# Patient Record
Sex: Female | Born: 1975 | Race: Black or African American | Hispanic: No | Smoking: Never smoker
Health system: Southern US, Community
[De-identification: ages and names within clinical notes are randomized; demographics above are authoritative.]

## PROBLEM LIST (undated history)

## (undated) DIAGNOSIS — I10 Essential (primary) hypertension: Secondary | ICD-10-CM

## (undated) DIAGNOSIS — N200 Calculus of kidney: Secondary | ICD-10-CM

## (undated) DIAGNOSIS — Z789 Other specified health status: Secondary | ICD-10-CM

## (undated) DIAGNOSIS — K529 Noninfective gastroenteritis and colitis, unspecified: Secondary | ICD-10-CM

## (undated) HISTORY — PX: TUBAL LIGATION: SHX77

---

## 2011-09-02 ENCOUNTER — Encounter (HOSPITAL_COMMUNITY): Payer: Self-pay

## 2011-09-02 ENCOUNTER — Emergency Department (HOSPITAL_COMMUNITY): Payer: Medicaid Other

## 2011-09-02 ENCOUNTER — Emergency Department (HOSPITAL_COMMUNITY)
Admission: EM | Admit: 2011-09-02 | Discharge: 2011-09-02 | Disposition: A | Discharge status: Home Health | Payer: Medicaid Other | Attending: Emergency Medicine | Admitting: Emergency Medicine

## 2011-09-02 DIAGNOSIS — M25571 Pain in right ankle and joints of right foot: Secondary | ICD-10-CM

## 2011-09-02 DIAGNOSIS — M25579 Pain in unspecified ankle and joints of unspecified foot: Secondary | ICD-10-CM | POA: Insufficient documentation

## 2011-09-02 MED ORDER — OXYCODONE-ACETAMINOPHEN 5-325 MG PO TABS
1.0000 | ORAL_TABLET | Freq: Once | ORAL | Status: AC
  Administered 2011-09-02: 1 via ORAL
  Filled 2011-09-02: qty 1

## 2011-09-02 MED ORDER — OXYCODONE-ACETAMINOPHEN 5-325 MG PO TABS: 1.0000 | ORAL_TABLET | ORAL | Status: AC | PRN

## 2011-09-02 MED ORDER — CYCLOBENZAPRINE HCL 10 MG PO TABS: 5.0000 mg | ORAL_TABLET | Freq: Two times a day (BID) | ORAL | Status: AC | PRN
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
## 2011-09-02 NOTE — Progress Notes (Signed)
Orthopedic Tech Progress Note Patient Details:  Christine Rowe 04-28-1976 [id number]  Other Ortho Devices Type of Ortho Device: Crutches;ASO Ortho Device Location: right ankle Ortho Device Interventions: Application   [name] 09/02/2011, 3:32 PM
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
## 2011-09-02 NOTE — ED Notes (Addendum)
Pt was cleared from the back board and c-collar by C. Williams PAC. No abrasion noted to legs nor rt hand. Family is at the bedside

## 2011-09-03 NOTE — ED Provider Notes (Signed)
Medical screening examination/treatment/procedure(s) were performed by non-physician practitioner and as supervising physician I was immediately available for consultation/collaboration.   Forbes Cellar, MD 09/03/11 1151

## 2012-02-22 ENCOUNTER — Emergency Department: Payer: Self-pay | Admitting: Emergency Medicine

## 2012-02-22 LAB — URINALYSIS, COMPLETE
Bilirubin,UR: NEGATIVE
Glucose,UR: NEGATIVE mg/dL (ref 0–75)
Ph: 8 (ref 4.5–8.0)
Squamous Epithelial: 2

## 2012-02-22 LAB — PREGNANCY, URINE: Pregnancy Test, Urine: NEGATIVE m[IU]/mL

## 2012-04-08 ENCOUNTER — Encounter (HOSPITAL_COMMUNITY): Payer: Self-pay | Admitting: Emergency Medicine

## 2012-04-08 ENCOUNTER — Emergency Department (HOSPITAL_COMMUNITY)
Admission: EM | Admit: 2012-04-08 | Discharge: 2012-04-08 | Disposition: A | Discharge status: Home / Self Care | Payer: Self-pay | Attending: Emergency Medicine | Admitting: Emergency Medicine

## 2012-04-08 DIAGNOSIS — J020 Streptococcal pharyngitis: Secondary | ICD-10-CM | POA: Insufficient documentation

## 2012-04-08 LAB — RAPID STREP SCREEN (MED CTR MEBANE ONLY): Streptococcus, Group A Screen (Direct): POSITIVE — AB

## 2012-04-08 MED ORDER — IBUPROFEN 800 MG PO TABS
800.0000 mg | ORAL_TABLET | Freq: Once | ORAL | Status: AC
  Administered 2012-04-08: 800 mg via ORAL
  Filled 2012-04-08: qty 1

## 2012-04-08 MED ORDER — AMOXICILLIN 500 MG PO CAPS
1000.0000 mg | ORAL_CAPSULE | Freq: Once | ORAL | Status: AC
  Administered 2012-04-08: 1000 mg via ORAL
  Filled 2012-04-08: qty 2

## 2012-04-08 MED ORDER — AMOXICILLIN 500 MG PO CAPS: 1000.0000 mg | ORAL_CAPSULE | Freq: Two times a day (BID) | ORAL | Status: DC

## 2012-04-08 NOTE — ED Notes (Signed)
Pt c/o severe sore throat and achy and feverish

## 2012-04-08 NOTE — ED Provider Notes (Signed)
History     CSN: [id number]  Arrival date & time 04/08/12  0915   First MD Initiated Contact with Patient 04/08/12 [phone]      Chief Complaint  Patient presents with  . Sore Throat    (Consider location/radiation/quality/duration/timing/severity/associated sxs/prior treatment) HPI Comments: 36 year old female with no chronic medical conditions presents for evaluation of sore throat. She is here with her son. Both have had sore throat for the past 4 days. She has had nasal congestion and hoarse voice for the past 2 days as well. No cough. Three days ago she had subjective fever but none since that time. No rashes. She has pain with swallowing but can swallow liquids and solids.  The history is provided by the patient.    History reviewed. No pertinent past medical history.  History reviewed. No pertinent past surgical history.  History reviewed. No pertinent family history.  History  Substance Use Topics  . Smoking status: Never Smoker   . Smokeless tobacco: Not on file  . Alcohol Use: No    OB History    Grav Para Term Preterm Abortions TAB SAB Ect Mult Living                  Review of Systems 10 systems were reviewed and were negative except as stated in the HPI  Allergies  Review of patient's allergies indicates no known allergies.  Home Medications  No current outpatient prescriptions on file.  BP 116/74  Pulse 105  Temp 98.7 F (37.1 C) (Oral)  Resp 20  Wt 233 lb 6.4 oz (105.87 kg)  SpO2 100%  LMP 03/18/2012  Physical Exam  Nursing note and vitals reviewed. Constitutional: She is oriented to person, place, and time. She appears well-developed and well-nourished. No distress.  HENT:  Head: Normocephalic and atraumatic.  Mouth/Throat: No oropharyngeal exudate.       TMs normal bilaterally; throat erythematous with petechiae on soft palate  Eyes: Conjunctivae normal and EOM are normal. Pupils are equal, round, and reactive to light.  Neck: Normal range  of motion. Neck supple.  Cardiovascular: Normal rate, regular rhythm and normal heart sounds.  Exam reveals no gallop and no friction rub.   No murmur heard. Pulmonary/Chest: Effort normal. No respiratory distress. She has no wheezes. She has no rales.  Abdominal: Soft. Bowel sounds are normal. There is no tenderness. There is no rebound and no guarding.  Musculoskeletal: Normal range of motion. She exhibits no tenderness.  Lymphadenopathy:    She has cervical adenopathy.  Neurological: She is alert and oriented to person, place, and time.       Normal strength 5/5 in upper and lower extremities, normal coordination  Skin: Skin is warm and dry. No rash noted.  Psychiatric: She has a normal mood and affect.    ED Course  Procedures (including critical care time)  Labs Reviewed  RAPID STREP SCREEN - Abnormal; Notable for the following:    Streptococcus, Group A Screen (Direct) POSITIVE (*)     All other components within normal limits       MDM  36 year old female with no chronic medical conditions here with her son. Both have had sore throat for the past 4 days. No fevers. She has some laryngitis as well. No vomiting. On exam her vital signs are normal. She has an erythematous pharynx with petechiae on her soft palate. Strep screen is positive. We will treat with a ten-day course of amoxicillin. Ibuprofen was given for  pain.Return precautions as outlined in the d/c instructions.         Wendi Maya, MD 04/08/12 2322

## 2012-07-26 ENCOUNTER — Emergency Department (HOSPITAL_COMMUNITY)
Admission: EM | Admit: 2012-07-26 | Discharge: 2012-07-26 | Disposition: A | Discharge status: Home / Self Care | Payer: Medicaid Other | Attending: Emergency Medicine | Admitting: Emergency Medicine

## 2012-07-26 ENCOUNTER — Encounter (HOSPITAL_COMMUNITY): Payer: Self-pay | Admitting: *Deleted

## 2012-07-26 DIAGNOSIS — R109 Unspecified abdominal pain: Secondary | ICD-10-CM | POA: Insufficient documentation

## 2012-07-26 DIAGNOSIS — Z9889 Other specified postprocedural states: Secondary | ICD-10-CM | POA: Insufficient documentation

## 2012-07-26 DIAGNOSIS — R197 Diarrhea, unspecified: Secondary | ICD-10-CM | POA: Insufficient documentation

## 2012-07-26 DIAGNOSIS — Z9851 Tubal ligation status: Secondary | ICD-10-CM | POA: Insufficient documentation

## 2012-07-26 DIAGNOSIS — Z3202 Encounter for pregnancy test, result negative: Secondary | ICD-10-CM | POA: Insufficient documentation

## 2012-07-26 LAB — URINALYSIS, MICROSCOPIC ONLY
Ketones, ur: 15 mg/dL — AB
Nitrite: NEGATIVE
Protein, ur: NEGATIVE mg/dL

## 2012-07-26 LAB — CBC WITH DIFFERENTIAL/PLATELET
HCT: 38 % (ref 36.0–46.0)
Hemoglobin: 12.4 g/dL (ref 12.0–15.0)
Lymphocytes Relative: 30 % (ref 12–46)
MCHC: 32.6 g/dL (ref 30.0–36.0)
Monocytes Absolute: 0.5 10*3/uL (ref 0.1–1.0)
Monocytes Relative: 7 % (ref 3–12)
Neutro Abs: 4.8 10*3/uL (ref 1.7–7.7)
WBC: 7.6 10*3/uL (ref 4.0–10.5)

## 2012-07-26 LAB — COMPREHENSIVE METABOLIC PANEL
BUN: 9 mg/dL (ref 6–23)
CO2: 24 mEq/L (ref 19–32)
Chloride: 105 mEq/L (ref 96–112)
Creatinine, Ser: 0.74 mg/dL (ref 0.50–1.10)
GFR calc non Af Amer: >90 mL/min (ref >90)
Total Bilirubin: 0.3 mg/dL (ref 0.3–1.2)

## 2012-07-26 LAB — LIPASE, BLOOD: Lipase: 56 U/L (ref 11–59)

## 2012-07-26 MED ORDER — METRONIDAZOLE 500 MG PO TABS: 500.0000 mg | ORAL_TABLET | Freq: Two times a day (BID) | ORAL | Status: DC

## 2012-07-26 MED ORDER — HYDROMORPHONE HCL PF 1 MG/ML IJ SOLN
1.0000 mg | Freq: Once | INTRAMUSCULAR | Status: AC
  Administered 2012-07-26: 1 mg via INTRAVENOUS
  Filled 2012-07-26: qty 1

## 2012-07-26 MED ORDER — CIPROFLOXACIN HCL 500 MG PO TABS: 500.0000 mg | ORAL_TABLET | Freq: Two times a day (BID) | ORAL | Status: DC

## 2012-07-26 MED ORDER — PROMETHAZINE HCL 25 MG PO TABS: 25.0000 mg | ORAL_TABLET | Freq: Four times a day (QID) | ORAL | Status: DC | PRN

## 2012-07-26 MED ORDER — HYDROCODONE-ACETAMINOPHEN 5-325 MG PO TABS: 2.0000 | ORAL_TABLET | ORAL | Status: DC | PRN

## 2012-07-26 MED ORDER — SODIUM CHLORIDE 0.9 % IV BOLUS (SEPSIS)
1000.0000 mL | Freq: Once | INTRAVENOUS | Status: AC
  Administered 2012-07-26: 1000 mL via INTRAVENOUS

## 2012-07-26 MED ORDER — ONDANSETRON HCL 4 MG/2ML IJ SOLN
4.0000 mg | Freq: Once | INTRAMUSCULAR | Status: AC
  Administered 2012-07-26: 4 mg via INTRAVENOUS
  Filled 2012-07-26: qty 2

## 2012-07-26 NOTE — ED Provider Notes (Signed)
History     CSN: [id number]  Arrival date & time 07/26/12  0417   First MD Initiated Contact with Patient 07/26/12 0543      Chief Complaint  Patient presents with  . Abdominal Pain    (Consider location/radiation/quality/duration/timing/severity/associated sxs/prior treatment) HPI History provided by patient. Has had 3 episodes in the last week of left-sided abdominal pain sharp and severe followed by bouts of diarrhea. No blood in stools. No fevers. Some nausea but no vomiting. Symptoms are intermittent and resolve on her own. Patient had no history of same. No medications. No recent travel. No known sick contacts. No history of IBS or colitis. No back pain. This is the third episode today and symptoms now moderate in severity.  No past medical history on file.  Past Surgical History  Procedure Date  . Cesarean section   . Tubal ligation     No family history on file.  History  Substance Use Topics  . Smoking status: Never Smoker   . Smokeless tobacco: Not on file  . Alcohol Use: No    OB History    Grav Para Term Preterm Abortions TAB SAB Ect Mult Living                  Review of Systems  Constitutional: Negative for fever and chills.  HENT: Negative for neck pain and neck stiffness.   Eyes: Negative for pain.  Respiratory: Negative for shortness of breath.   Cardiovascular: Negative for chest pain.  Gastrointestinal: Positive for abdominal pain and diarrhea. Negative for blood in stool.  Genitourinary: Negative for dysuria.  Musculoskeletal: Negative for back pain.  Skin: Negative for rash.  Neurological: Negative for headaches.  All other systems reviewed and are negative.    Allergies  Review of patient's allergies indicates no known allergies.  Home Medications  No current outpatient prescriptions on file.  BP 107/81  Pulse 90  Temp 97.5 F (36.4 C) (Oral)  Resp 20  SpO2 96%  Physical Exam  Constitutional: She is oriented to person, place,  and time. She appears well-developed and well-nourished.  HENT:  Head: Normocephalic and atraumatic.  Eyes: Conjunctivae normal and EOM are normal. Pupils are equal, round, and reactive to light.  Neck: Trachea normal. Neck supple. No thyromegaly present.  Cardiovascular: Normal rate, regular rhythm, S1 normal, S2 normal and normal pulses.     No systolic murmur is present   No diastolic murmur is present  Pulses:      Radial pulses are 2+ on the right side, and 2+ on the left side.  Pulmonary/Chest: Effort normal and breath sounds normal. She has no wheezes. She has no rhonchi. She has no rales. She exhibits no tenderness.  Abdominal: Soft. Normal appearance and bowel sounds are normal. She exhibits no distension. There is no CVA tenderness and negative Murphy's sign.       Mild left lower quadrant tenderness without guarding or rebound/ peritonitis  Musculoskeletal:       BLE:s Calves nontender, no cords or erythema, negative Homans sign  Neurological: She is alert and oriented to person, place, and time. She has normal strength. No cranial nerve deficit or sensory deficit. GCS eye subscore is 4. GCS verbal subscore is 5. GCS motor subscore is 6.  Skin: Skin is warm and dry. No rash noted. She is not diaphoretic.  Psychiatric: Her speech is normal.       Cooperative and appropriate    ED Course  Procedures (including critical  care time)  Results for orders placed during the hospital encounter of 07/26/12  CBC WITH DIFFERENTIAL      Component Value Range   WBC 7.6  4.0 - 10.5 K/uL   RBC 5.08  3.87 - 5.11 MIL/uL   Hemoglobin 12.4  12.0 - 15.0 g/dL   HCT 57.8  46.9 - 62.9 %   MCV 74.8 (*) 78.0 - 100.0 fL   MCH 24.4 (*) 26.0 - 34.0 pg   MCHC 32.6  30.0 - 36.0 g/dL   RDW 52.8 (*) 41.3 - 24.4 %   Platelets 273  150 - 400 K/uL   Neutrophils Relative 63  43 - 77 %   Neutro Abs 4.8  1.7 - 7.7 K/uL   Lymphocytes Relative 30  12 - 46 %   Lymphs Abs 2.3  0.7 - 4.0 K/uL   Monocytes  Relative 7  3 - 12 %   Monocytes Absolute 0.5  0.1 - 1.0 K/uL   Eosinophils Relative 0  0 - 5 %   Eosinophils Absolute 0.0  0.0 - 0.7 K/uL   Basophils Relative 0  0 - 1 %   Basophils Absolute 0.0  0.0 - 0.1 K/uL  COMPREHENSIVE METABOLIC PANEL      Component Value Range   Sodium 140  135 - 145 mEq/L   Potassium 3.4 (*) 3.5 - 5.1 mEq/L   Chloride 105  96 - 112 mEq/L   CO2 24  19 - 32 mEq/L   Glucose, Bld 125 (*) 70 - 99 mg/dL   BUN 9  6 - 23 mg/dL   Creatinine, Ser 0.10  0.50 - 1.10 mg/dL   Calcium 9.7  8.4 - 27.2 mg/dL   Total Protein 7.4  6.0 - 8.3 g/dL   Albumin 3.3 (*) 3.5 - 5.2 g/dL   AST 13  0 - 37 U/L   ALT 10  0 - 35 U/L   Alkaline Phosphatase 101  39 - 117 U/L   Total Bilirubin 0.3  0.3 - 1.2 mg/dL   GFR calc non Af Amer >90  >90 mL/min   GFR calc Af Amer >90  >90 mL/min  LIPASE, BLOOD      Component Value Range   Lipase 56  11 - 59 U/L  URINALYSIS, MICROSCOPIC ONLY      Component Value Range   Color, Urine YELLOW  YELLOW   APPearance CLOUDY (*) CLEAR   Specific Gravity, Urine 1.027  1.005 - 1.030   pH 5.5  5.0 - 8.0   Glucose, UA NEGATIVE  NEGATIVE mg/dL   Hgb urine dipstick SMALL (*) NEGATIVE   Bilirubin Urine NEGATIVE  NEGATIVE   Ketones, ur 15 (*) NEGATIVE mg/dL   Protein, ur NEGATIVE  NEGATIVE mg/dL   Urobilinogen, UA 1.0  0.0 - 1.0 mg/dL   Nitrite NEGATIVE  NEGATIVE   Leukocytes, UA SMALL (*) NEGATIVE   WBC, UA 7-10  <3 WBC/hpf   RBC / HPF 3-6  <3 RBC/hpf   Bacteria, UA FEW (*) RARE   Squamous Epithelial / LPF MANY (*) RARE  POCT PREGNANCY, URINE      Component Value Range   Preg Test, Ur NEGATIVE  NEGATIVE    IV fluids. IV Zofran. IV Dilaudid.  On recheck is feeling better. No significant tenderness on exam and discussed options including treatment for colitis with outpatient followup which patient agrees to. Rx provided with GI referral. Strict return precautions verbalizes understood. MDM  Abdominal pain and diarrhea consistent  with colitis.  Patient treated for same. Labs and UA reviewed as above - urinalysis contaminated. No leukocytosis. Afebrile with vital signs in normal range, stable for discharge home.        Sunnie Nielsen, MD 07/26/12 [phone]

## 2012-07-26 NOTE — ED Notes (Signed)
Pt states that at 0300 She began feeling a knot in her stomach, Nausea, diarrhea, chills, lower back pain, and lower abdominal pain that is sharp shooting. This is the third episode since last Saturday, with the 2nd episode happening on Tuesday. LMP on the 13th, Last BM at 0300

## 2012-07-27 LAB — URINE CULTURE

## 2012-08-10 ENCOUNTER — Emergency Department (HOSPITAL_COMMUNITY)
Admission: EM | Admit: 2012-08-10 | Discharge: 2012-08-11 | Disposition: A | Discharge status: Home / Self Care | Payer: Medicaid Other | Attending: Emergency Medicine | Admitting: Emergency Medicine

## 2012-08-10 ENCOUNTER — Encounter (HOSPITAL_COMMUNITY): Payer: Self-pay | Admitting: Emergency Medicine

## 2012-08-10 DIAGNOSIS — T7840XA Allergy, unspecified, initial encounter: Secondary | ICD-10-CM

## 2012-08-10 DIAGNOSIS — Z8719 Personal history of other diseases of the digestive system: Secondary | ICD-10-CM | POA: Insufficient documentation

## 2012-08-10 DIAGNOSIS — L509 Urticaria, unspecified: Secondary | ICD-10-CM | POA: Insufficient documentation

## 2012-08-10 HISTORY — DX: Noninfective gastroenteritis and colitis, unspecified: K52.9

## 2012-08-10 MED ORDER — EPINEPHRINE 0.3 MG/0.3ML IJ DEVI: 0.3000 mg | INTRAMUSCULAR | Status: DC | PRN

## 2012-08-10 MED ORDER — DIPHENHYDRAMINE HCL 25 MG PO TABS: 25.0000 mg | ORAL_TABLET | Freq: Four times a day (QID) | ORAL | Status: DC

## 2012-08-10 MED ORDER — DIPHENHYDRAMINE HCL 50 MG/ML IJ SOLN
25.0000 mg | Freq: Once | INTRAMUSCULAR | Status: AC
  Administered 2012-08-11: 25 mg via INTRAMUSCULAR
  Filled 2012-08-10: qty 1

## 2012-08-10 MED ORDER — FAMOTIDINE 20 MG PO TABS
20.0000 mg | ORAL_TABLET | Freq: Once | ORAL | Status: AC
  Administered 2012-08-10: 20 mg via ORAL
  Filled 2012-08-10: qty 1

## 2012-08-10 MED ORDER — PREDNISONE 20 MG PO TABS
60.0000 mg | ORAL_TABLET | Freq: Once | ORAL | Status: AC
  Administered 2012-08-10: 60 mg via ORAL
  Filled 2012-08-10: qty 3

## 2012-08-10 MED ORDER — DIPHENHYDRAMINE HCL 25 MG PO CAPS
25.0000 mg | ORAL_CAPSULE | Freq: Once | ORAL | Status: AC
  Administered 2012-08-10: 25 mg via ORAL
  Filled 2012-08-10: qty 1

## 2012-08-10 MED ORDER — PREDNISONE 50 MG PO TABS: ORAL_TABLET | ORAL | Status: DC

## 2012-08-10 MED ORDER — FAMOTIDINE 20 MG PO TABS: 20.0000 mg | ORAL_TABLET | Freq: Two times a day (BID) | ORAL | Status: DC

## 2012-08-10 NOTE — ED Provider Notes (Signed)
History  This chart was scribed for Glynn Octave, MD by Bennett Scrape, ED Scribe. This patient was seen in room D33C/D33C and the patient's care was started at 9:41 PM.  CSN: [id number]  Arrival date & time 08/10/12  2133   First MD Initiated Contact with Patient 08/10/12 2141      Chief Complaint  Patient presents with  . Urticaria     The history is provided by the patient. No language interpreter was used.    Christine Rowe is a 37 y.o. female who presents to the Emergency Department complaining of 3 days of gradual onset, gradually worsening, constant allergic reaction described as urticaria on her arms, back, abdomen and bilateral inguinal area with associated "tingling" lips tonight. She states that she did have bilateral lip swelling with the original onset that resolved. She denies any new hygiene or detergent products. She reports that she finished Cipro 4 days ago but denies having any similar symptoms while taking the medication. She states that she has been taking benadryl at home with temporary relief. Pt states that her father also has similar symptoms and has several allergies; however, pt denies having any known allergies. She denies difficulty breathing or swallowing. She denies CP, abdominal pain, SOB and sore throat as associated symptoms. She denies smoking and alcohol use.  PCP is Dr. Pecola Leisure  Past Medical History  Diagnosis Date  . Colitis     Past Surgical History  Procedure Date  . Cesarean section   . Tubal ligation     No family history on file.  History  Substance Use Topics  . Smoking status: Never Smoker   . Smokeless tobacco: Not on file  . Alcohol Use: No    No OB history provided.  Review of Systems  A complete 10 system review of systems was obtained and all systems are negative except as noted in the HPI and PMH.   Allergies  Review of patient's allergies indicates no known allergies.  Home Medications   Current Outpatient Rx    Name  Route  Sig  Dispense  Refill  . CIPROFLOXACIN HCL 500 MG PO TABS   Oral   Take 1 tablet (500 mg total) by mouth 2 (two) times daily.   20 tablet   0   . HYDROCODONE-ACETAMINOPHEN 5-325 MG PO TABS   Oral   Take 2 tablets by mouth every 4 (four) hours as needed for pain.   10 tablet   0   . METRONIDAZOLE 500 MG PO TABS   Oral   Take 1 tablet (500 mg total) by mouth 2 (two) times daily.   14 tablet   0   . PROMETHAZINE HCL 25 MG PO TABS   Oral   Take 1 tablet (25 mg total) by mouth every 6 (six) hours as needed for nausea.   30 tablet   0     Triage Vitals: BP 129/62  Pulse 110  Temp 97.9 F (36.6 C) (Oral)  Resp 16  SpO2 98%  LMP 07/28/2012  Physical Exam  Nursing note and vitals reviewed. Constitutional: She is oriented to person, place, and time. She appears well-developed and well-nourished. No distress.  HENT:  Head: Normocephalic and atraumatic.  Mouth/Throat: Oropharynx is clear and moist.       No obvious lip swelling, no asymmetry, uvula is midline   Eyes: Conjunctivae normal and EOM are normal. Pupils are equal, round, and reactive to light.  Neck: Neck supple. No tracheal deviation present.  Cardiovascular: Normal rate and regular rhythm.   Pulmonary/Chest: Effort normal and breath sounds normal. No respiratory distress. She has no wheezes.  Abdominal: Soft. There is no tenderness.  Musculoskeletal: Normal range of motion.  Neurological: She is alert and oriented to person, place, and time. No cranial nerve deficit.       No ataxia on finger to nose, 5/5 strength throughout   Skin: Skin is warm and dry. Rash noted.       Urticaria on back of neck, back, abdomen and upper extremities   Psychiatric: She has a normal mood and affect. Her behavior is normal.    ED Course  Procedures (including critical care time)  DIAGNOSTIC STUDIES: Oxygen Saturation is 98% on room air, normal by my interpretation.    COORDINATION OF CARE: 9:50 PM-Discussed  treatment plan which includes  with pt at bedside and pt agreed to plan.   Labs Reviewed - No data to display No results found.   No diagnosis found.    MDM  Patient complains of generalized itchy rash for the past 2 days to the back of her neck, arms, chest. Denies any difficulty breathing, trouble swallowing or talking. Denies any new medications, foods, hygiene products. Symptoms started after she got her hair done.  She was recently treated for colitis but has been off of antibiotics for the past 4 days.  On exam she is in no distress, no wheezing, controlling secretions. There is no obvious lip swelling. No uvular edema, no asymmetry. Lungs are clear.  She is given antihistamines and steroids. Monitored in the ED for one hour without worsening symptoms.    I personally performed the services described in this documentation, which was scribed in my presence. The recorded information has been reviewed and is accurate.    Glynn Octave, MD 08/10/12 2352

## 2012-08-10 NOTE — ED Notes (Signed)
PT. REPORTS GENERALIZED ITCHY RASHES / HIVES ONSET LAST Sunday , TRIED OTC BENADRYL WITH TEMPORARY RELIEF , RESPIRATIONS UNLABORED / AIRWAY INTACT.

## 2012-08-11 NOTE — ED Notes (Signed)
Talking with the patient they have decided that eating scallops is what caused her to have a reaction.  Stated several years ago the same thing happened to her

## 2012-08-18 ENCOUNTER — Inpatient Hospital Stay (HOSPITAL_COMMUNITY)
Admission: EM | Admit: 2012-08-18 | Discharge: 2012-08-20 | DRG: 439 | Disposition: A | Discharge status: Home / Self Care | Payer: MEDICAID | Attending: Internal Medicine | Admitting: Internal Medicine

## 2012-08-18 ENCOUNTER — Inpatient Hospital Stay (HOSPITAL_COMMUNITY): Payer: Self-pay

## 2012-08-18 ENCOUNTER — Encounter (HOSPITAL_COMMUNITY): Payer: Self-pay | Admitting: Emergency Medicine

## 2012-08-18 ENCOUNTER — Observation Stay (HOSPITAL_COMMUNITY): Payer: Self-pay

## 2012-08-18 ENCOUNTER — Emergency Department (HOSPITAL_COMMUNITY): Payer: Self-pay

## 2012-08-18 DIAGNOSIS — E860 Dehydration: Secondary | ICD-10-CM | POA: Diagnosis present

## 2012-08-18 DIAGNOSIS — K859 Acute pancreatitis without necrosis or infection, unspecified: Principal | ICD-10-CM

## 2012-08-18 DIAGNOSIS — R6510 Systemic inflammatory response syndrome (SIRS) of non-infectious origin without acute organ dysfunction: Secondary | ICD-10-CM

## 2012-08-18 DIAGNOSIS — R197 Diarrhea, unspecified: Secondary | ICD-10-CM

## 2012-08-18 DIAGNOSIS — N289 Disorder of kidney and ureter, unspecified: Secondary | ICD-10-CM

## 2012-08-18 HISTORY — DX: Other specified health status: Z78.9

## 2012-08-18 LAB — CBC
MCH: 25.7 pg — ABNORMAL LOW (ref 26.0–34.0)
MCH: 24.9 pg — ABNORMAL LOW (ref 26.0–34.0)
MCHC: 33.7 g/dL (ref 30.0–36.0)
MCHC: 33.2 g/dL (ref 30.0–36.0)
MCV: 75.1 fL — ABNORMAL LOW (ref 78.0–100.0)
Platelets: 366 10*3/uL (ref 150–400)
Platelets: 344 10*3/uL (ref 150–400)
RBC: 6.11 MIL/uL — ABNORMAL HIGH (ref 3.87–5.11)
RDW: 16.4 % — ABNORMAL HIGH (ref 11.5–15.5)
RDW: 16.5 % — ABNORMAL HIGH (ref 11.5–15.5)

## 2012-08-18 LAB — BASIC METABOLIC PANEL
BUN: 13 mg/dL (ref 6–23)
CO2: 25 mEq/L (ref 19–32)
Calcium: 7.8 mg/dL — ABNORMAL LOW (ref 8.4–10.5)
Chloride: 106 mEq/L (ref 96–112)
Creatinine, Ser: 0.86 mg/dL (ref 0.50–1.10)
GFR calc Af Amer: >90 mL/min (ref >90)
GFR calc non Af Amer: 87 mL/min — ABNORMAL LOW (ref >90)
Glucose, Bld: 121 mg/dL — ABNORMAL HIGH (ref 70–99)
Potassium: 4.8 mEq/L (ref 3.5–5.1)
Sodium: 138 mEq/L (ref 135–145)

## 2012-08-18 LAB — LIPASE, BLOOD: Lipase: 439 U/L — ABNORMAL HIGH (ref 11–59)

## 2012-08-18 LAB — URINALYSIS, ROUTINE W REFLEX MICROSCOPIC
Ketones, ur: NEGATIVE mg/dL
Protein, ur: NEGATIVE mg/dL
Urobilinogen, UA: 0.2 mg/dL (ref 0.0–1.0)

## 2012-08-18 LAB — COMPREHENSIVE METABOLIC PANEL
ALT: 21 U/L (ref 0–35)
AST: 24 U/L (ref 0–37)
Albumin: 3.1 g/dL — ABNORMAL LOW (ref 3.5–5.2)
CO2: 23 mEq/L (ref 19–32)
Calcium: 9.6 mg/dL (ref 8.4–10.5)
Creatinine, Ser: 1.04 mg/dL (ref 0.50–1.10)
Sodium: 138 mEq/L (ref 135–145)
Total Protein: 7 g/dL (ref 6.0–8.3)

## 2012-08-18 LAB — POCT I-STAT, CHEM 8
BUN: 18 mg/dL (ref 6–23)
Calcium, Ion: 1.17 mmol/L (ref 1.12–1.23)
Chloride: 102 mEq/L (ref 96–112)
Creatinine, Ser: 1.2 mg/dL — ABNORMAL HIGH (ref 0.50–1.10)
TCO2: 26 mmol/L (ref 0–100)

## 2012-08-18 LAB — RAPID URINE DRUG SCREEN, HOSP PERFORMED
Amphetamines: NOT DETECTED
Barbiturates: NOT DETECTED
Opiates: POSITIVE — AB
Tetrahydrocannabinol: NOT DETECTED

## 2012-08-18 LAB — OCCULT BLOOD, POC DEVICE: Fecal Occult Bld: POSITIVE — AB

## 2012-08-18 LAB — CREATININE, SERUM: Creatinine, Ser: 0.7 mg/dL (ref 0.50–1.10)

## 2012-08-18 LAB — PREGNANCY, URINE: Preg Test, Ur: NEGATIVE

## 2012-08-18 LAB — URINE MICROSCOPIC-ADD ON

## 2012-08-18 LAB — TYPE AND SCREEN: Antibody Screen: NEGATIVE

## 2012-08-18 LAB — CLOSTRIDIUM DIFFICILE BY PCR: Toxigenic C. Difficile by PCR: NEGATIVE

## 2012-08-18 LAB — TRIGLYCERIDES: Triglycerides: 82 mg/dL (ref <150)

## 2012-08-18 LAB — CG4 I-STAT (LACTIC ACID): Lactic Acid, Venous: 4.38 mmol/L — ABNORMAL HIGH (ref 0.5–2.2)

## 2012-08-18 LAB — SEDIMENTATION RATE: Sed Rate: 12 mm/hr (ref 0–22)

## 2012-08-18 MED ORDER — SODIUM CHLORIDE 0.9 % IV BOLUS (SEPSIS)
1000.0000 mL | Freq: Once | INTRAVENOUS | Status: AC
  Administered 2012-08-18: 1000 mL via INTRAVENOUS

## 2012-08-18 MED ORDER — CIPROFLOXACIN IN D5W 400 MG/200ML IV SOLN
400.0000 mg | Freq: Two times a day (BID) | INTRAVENOUS | Status: DC
  Administered 2012-08-18 – 2012-08-20 (×5): 400 mg via INTRAVENOUS
  Filled 2012-08-18 (×7): qty 200

## 2012-08-18 MED ORDER — HYDROCODONE-ACETAMINOPHEN 5-325 MG PO TABS
1.0000 | ORAL_TABLET | ORAL | Status: DC | PRN
  Administered 2012-08-19: 1 via ORAL
  Filled 2012-08-18: qty 1

## 2012-08-18 MED ORDER — MORPHINE SULFATE 2 MG/ML IJ SOLN
1.0000 mg | INTRAMUSCULAR | Status: DC | PRN
  Administered 2012-08-18: 1 mg via INTRAVENOUS
  Filled 2012-08-18: qty 1

## 2012-08-18 MED ORDER — ONDANSETRON HCL 4 MG/2ML IJ SOLN: 4.0000 mg | Freq: Three times a day (TID) | INTRAMUSCULAR | Status: DC | PRN

## 2012-08-18 MED ORDER — ENOXAPARIN SODIUM 60 MG/0.6ML ~~LOC~~ SOLN
0.5000 mg/kg | SUBCUTANEOUS | Status: DC
  Administered 2012-08-18: 60 mg via SUBCUTANEOUS
  Filled 2012-08-18 (×3): qty 0.6

## 2012-08-18 MED ORDER — ENOXAPARIN SODIUM 30 MG/0.3ML ~~LOC~~ SOLN: 30.0000 mg | SUBCUTANEOUS | Status: DC

## 2012-08-18 MED ORDER — ONDANSETRON HCL 4 MG PO TABS: 4.0000 mg | ORAL_TABLET | Freq: Four times a day (QID) | ORAL | Status: DC | PRN

## 2012-08-18 MED ORDER — METRONIDAZOLE IN NACL 5-0.79 MG/ML-% IV SOLN
500.0000 mg | Freq: Three times a day (TID) | INTRAVENOUS | Status: DC
  Administered 2012-08-18 – 2012-08-20 (×6): 500 mg via INTRAVENOUS
  Filled 2012-08-18 (×7): qty 100

## 2012-08-18 MED ORDER — FENTANYL CITRATE 0.05 MG/ML IJ SOLN
25.0000 ug | INTRAMUSCULAR | Status: DC | PRN
  Administered 2012-08-18: 25 ug via INTRAVENOUS
  Filled 2012-08-18: qty 2

## 2012-08-18 MED ORDER — ONDANSETRON HCL 4 MG/2ML IJ SOLN: 4.0000 mg | Freq: Four times a day (QID) | INTRAMUSCULAR | Status: DC | PRN

## 2012-08-18 MED ORDER — IOHEXOL 300 MG/ML  SOLN
20.0000 mL | INTRAMUSCULAR | Status: AC
  Administered 2012-08-18: 25 mL via ORAL

## 2012-08-18 MED ORDER — ONDANSETRON HCL 4 MG/2ML IJ SOLN
4.0000 mg | Freq: Once | INTRAMUSCULAR | Status: AC
  Administered 2012-08-18: 4 mg via INTRAVENOUS
  Filled 2012-08-18: qty 2

## 2012-08-18 MED ORDER — HYDROMORPHONE HCL PF 1 MG/ML IJ SOLN: 1.0000 mg | INTRAMUSCULAR | Status: DC | PRN

## 2012-08-18 MED ORDER — SODIUM CHLORIDE 0.9 % IV BOLUS (SEPSIS): 500.0000 mL | Freq: Once | INTRAVENOUS | Status: DC

## 2012-08-18 MED ORDER — SODIUM CHLORIDE 0.9 % IV SOLN: INTRAVENOUS | Status: DC

## 2012-08-18 MED ORDER — IOHEXOL 300 MG/ML  SOLN
80.0000 mL | Freq: Once | INTRAMUSCULAR | Status: AC | PRN
  Administered 2012-08-18: 80 mL via INTRAVENOUS

## 2012-08-18 MED ORDER — SODIUM CHLORIDE 0.9 % IV SOLN
INTRAVENOUS | Status: DC
  Administered 2012-08-18 – 2012-08-19 (×5): via INTRAVENOUS

## 2012-08-18 NOTE — Progress Notes (Signed)
Utilization Review Completed.Eino Whitner T2/11/2012   

## 2012-08-18 NOTE — ED Notes (Signed)
Pt states abdominal pain was improved after medication until began drinking oral contrast. States she cannot drink anymore oral contrast. Pt appearing weak, tired. HR remains 110. Pt husband at bedside. No diarrhea x at least an hour. Pt transported to [place]. Waiting for bed placement.

## 2012-08-18 NOTE — ED Notes (Signed)
Patient transported to CT 

## 2012-08-18 NOTE — ED Notes (Signed)
Phlebotomy at bedside drawing labs and blood cultures.

## 2012-08-18 NOTE — Progress Notes (Signed)
Discussed with MD about pt is negative  for c. diff- pcr . Still wanted to be on isolation until  Stool  for GI  Pathogens is collected and resulted.

## 2012-08-18 NOTE — ED Provider Notes (Signed)
History     CSN: [id number]  Arrival date & time 08/18/12  0414   First MD Initiated Contact with Patient 08/18/12 0440      Chief Complaint  Patient presents with  . Nausea  . Emesis  . Diarrhea    (Consider location/radiation/quality/duration/timing/severity/associated sxs/prior treatment) HPI Hx per PT. Onset today diffuse ABD pain with n/v/d, no blood in emesis or stools, no F/C, no recent travel, no sick contacts. No CP or SOB, pain is severe, sharp and not radiating. H/o same Dx with colitis, has never had a CT scan or h/o GB problems. No known aggrevating or alleviating factors.  Past Medical History  Diagnosis Date  . Colitis     Past Surgical History  Procedure Date  . Cesarean section   . Tubal ligation     History reviewed. No pertinent family history.  History  Substance Use Topics  . Smoking status: Never Smoker   . Smokeless tobacco: Not on file  . Alcohol Use: No    OB History    Grav Para Term Preterm Abortions TAB SAB Ect Mult Living                  Review of Systems  Constitutional: Negative for fever and chills.  HENT: Negative for neck pain and neck stiffness.   Eyes: Negative for pain.  Respiratory: Negative for shortness of breath.   Cardiovascular: Negative for chest pain.  Gastrointestinal: Positive for nausea, vomiting, abdominal pain and diarrhea.  Genitourinary: Negative for dysuria.  Musculoskeletal: Negative for back pain.  Skin: Negative for rash.  Neurological: Negative for headaches.  All other systems reviewed and are negative.    Allergies  Review of patient's allergies indicates no known allergies.  Home Medications   Current Outpatient Rx  Name  Route  Sig  Dispense  Refill  . DIPHENHYDRAMINE HCL 25 MG PO TABS   Oral   Take 1 tablet (25 mg total) by mouth every 6 (six) hours.   20 tablet   0   . FAMOTIDINE 20 MG PO TABS   Oral   Take 1 tablet (20 mg total) by mouth 2 (two) times daily.   30 tablet   0    . PREDNISONE 50 MG PO TABS      1 tablet PO daily   5 tablet   0   . PROMETHAZINE HCL 25 MG PO TABS   Oral   Take 1 tablet (25 mg total) by mouth every 6 (six) hours as needed for nausea.   30 tablet   0   . EPINEPHRINE 0.3 MG/0.3ML IJ DEVI   Intramuscular   Inject 0.3 mLs (0.3 mg total) into the muscle as needed.   1 Device   0     BP 102/69  Pulse 123  Temp 99.9 F (37.7 C) (Rectal)  Resp 22  LMP 07/28/2012  Physical Exam  Constitutional: She is oriented to person, place, and time. She appears well-developed and well-nourished.  HENT:  Head: Normocephalic and atraumatic.       Dry mm  Eyes: EOM are normal. Pupils are equal, round, and reactive to light. No scleral icterus.  Neck: Neck supple.  Cardiovascular: Regular rhythm and intact distal pulses.        tachycardic  Pulmonary/Chest: Effort normal and breath sounds normal. No stridor. No respiratory distress. She has no wheezes. She has no rales.  Abdominal: Soft.       Diffuse ABd tenderness with vol  guarding.  Musculoskeletal: Normal range of motion. She exhibits no edema.  Neurological: She is alert and oriented to person, place, and time.  Skin: Skin is warm and dry. No rash noted.    ED Course  Procedures (including critical care time)  Results for orders placed during the hospital encounter of 08/18/12  CBC      Component Value Range   WBC 11.1 (*) 4.0 - 10.5 K/uL   RBC 6.11 (*) 3.87 - 5.11 MIL/uL   Hemoglobin 15.7 (*) 12.0 - 15.0 g/dL   HCT 29.5 (*) 62.1 - 30.8 %   MCV 76.3 (*) 78.0 - 100.0 fL   MCH 25.7 (*) 26.0 - 34.0 pg   MCHC 33.7  30.0 - 36.0 g/dL   RDW 65.7 (*) 84.6 - 96.2 %   Platelets 366  150 - 400 K/uL  COMPREHENSIVE METABOLIC PANEL      Component Value Range   Sodium 138  135 - 145 mEq/L   Potassium 3.7  3.5 - 5.1 mEq/L   Chloride 99  96 - 112 mEq/L   CO2 23  19 - 32 mEq/L   Glucose, Bld 136 (*) 70 - 99 mg/dL   BUN 17  6 - 23 mg/dL   Creatinine, Ser 9.52  0.50 - 1.10 mg/dL    Calcium 9.6  8.4 - 84.1 mg/dL   Total Protein 7.0  6.0 - 8.3 g/dL   Albumin 3.1 (*) 3.5 - 5.2 g/dL   AST 24  0 - 37 U/L   ALT 21  0 - 35 U/L   Alkaline Phosphatase 107  39 - 117 U/L   Total Bilirubin 0.4  0.3 - 1.2 mg/dL   GFR calc non Af Amer 69 (*) >90 mL/min   GFR calc Af Amer 80 (*) >90 mL/min  TYPE AND SCREEN      Component Value Range   ABO/RH(D) AB POS     Antibody Screen NEG     Sample Expiration 08/21/2012    LIPASE, BLOOD      Component Value Range   Lipase 439 (*) 11 - 59 U/L  PREGNANCY, URINE      Component Value Range   Preg Test, Ur NEGATIVE  NEGATIVE  URINALYSIS, ROUTINE W REFLEX MICROSCOPIC      Component Value Range   Color, Urine YELLOW  YELLOW   APPearance CLEAR  CLEAR   Specific Gravity, Urine 1.022  1.005 - 1.030   pH 6.0  5.0 - 8.0   Glucose, UA NEGATIVE  NEGATIVE mg/dL   Hgb urine dipstick NEGATIVE  NEGATIVE   Bilirubin Urine NEGATIVE  NEGATIVE   Ketones, ur NEGATIVE  NEGATIVE mg/dL   Protein, ur NEGATIVE  NEGATIVE mg/dL   Urobilinogen, UA 0.2  0.0 - 1.0 mg/dL   Nitrite NEGATIVE  NEGATIVE   Leukocytes, UA TRACE (*) NEGATIVE  POCT I-STAT, CHEM 8      Component Value Range   Sodium 139  135 - 145 mEq/L   Potassium 3.6  3.5 - 5.1 mEq/L   Chloride 102  96 - 112 mEq/L   BUN 18  6 - 23 mg/dL   Creatinine, Ser 3.24 (*) 0.50 - 1.10 mg/dL   Glucose, Bld 401 (*) 70 - 99 mg/dL   Calcium, Ion 0.27  2.53 - 1.23 mmol/L   TCO2 26  0 - 100 mmol/L   Hemoglobin 17.0 (*) 12.0 - 15.0 g/dL   HCT 66.4 (*) 40.3 - 47.4 %  CG4 I-STAT (LACTIC  ACID)      Component Value Range   Lactic Acid, Venous 4.38 (*) 0.5 - 2.2 mmol/L  URINE MICROSCOPIC-ADD ON      Component Value Range   Squamous Epithelial / LPF RARE  RARE   WBC, UA 3-6  <3 WBC/hpf   RBC / HPF 3-6  <3 RBC/hpf   Bacteria, UA RARE  RARE   Urine-Other MUCOUS PRESENT    ABO/RH      Component Value Range   ABO/RH(D) AB POS      Date: 08/18/2012  Rate: 129  Rhythm: sinus tachycardia  QRS Axis: normal   Intervals: normal  ST/T Wave abnormalities: nonspecific ST changes  Conduction Disutrbances:none  Narrative Interpretation:   Old EKG Reviewed: none available  IVFs. IV zofran. IV fentanyl.   7:13 AM d/w Dr Sunnie Nielsen, after 1L IVFs BP 102/ 69. CT pending, ordered US GB. LFTs WNL. Cont IVFs for elevated lactate.   MDM  ABD pain new onset pancreatitis.   IVFs, IV narcotics  Labs and imaging.   MED admit.         Sunnie Nielsen, MD 08/18/12 [phone]

## 2012-08-18 NOTE — ED Notes (Signed)
Pt from home via EMS, c/o sudden onset of N/V/D and pain.

## 2012-08-18 NOTE — ED Notes (Signed)
Patient transported to Ultrasound 

## 2012-08-18 NOTE — Consult Note (Signed)
[facility] Consultation Note  Referring Provider: Dr. Hartley Barefoot ([facility]) Primary Care Physician:  Houston Siren, MD  Reason for Consultation:  Abdominal pain, diarrhea, abnormal CT scan.  HPI: Christine Rowe is a 37 y.o. female admitted for, and whom we've been asked to see on behalf of, the above problems.  Patient was in static state of GI health until yesterday.  At that time, she had acute onset of periumbilical abdominal with cramping "knot-like" sensation.  Symptoms in association with non-bloody diarrhea as well as nausea and vomiting.  No fevers or sick contacts.  Had similar flare about one month ago, told she had "colitis", but didn't have any imaging at that time, and her symptoms resolved after a day or two.  Today, she had CT with mid-to-distal small bowel thickening and patchy colonic thickening.  Her diarrhea is improving but not resolved.  Nausea and vomiting have resolved.  Patient endorses history of having had colonoscopy many years ago, but does not recall why she had this done.   Past Medical History  Diagnosis Date  . Colitis     Past Surgical History  Procedure Date  . Cesarean section   . Tubal ligation     Prior to Admission medications   Medication Sig Start Date End Date Taking? Authorizing Provider  diphenhydrAMINE (BENADRYL) 25 MG tablet Take 1 tablet (25 mg total) by mouth every 6 (six) hours. 08/10/12  Yes Glynn Octave, MD  famotidine (PEPCID) 20 MG tablet Take 1 tablet (20 mg total) by mouth 2 (two) times daily. 08/10/12  Yes Glynn Octave, MD  predniSONE (DELTASONE) 50 MG tablet 1 tablet PO daily 08/10/12  Yes Glynn Octave, MD  promethazine (PHENERGAN) 25 MG tablet Take 1 tablet (25 mg total) by mouth every 6 (six) hours as needed for nausea. 07/26/12  Yes Sunnie Nielsen, MD  EPINEPHrine (EPIPEN) 0.3 mg/0.3 mL DEVI Inject 0.3 mLs (0.3 mg total) into the muscle as needed. 08/10/12   Glynn Octave, MD    Current  Facility-Administered Medications  Medication Dose Route Frequency Provider Last Rate Last Dose  . 0.9 %  sodium chloride infusion   Intravenous Continuous Belkys A Regalado, MD      . ciprofloxacin (CIPRO) IVPB 400 mg  400 mg Intravenous Q12H Belkys A Regalado, MD   400 mg at 08/18/12 0932  . enoxaparin (LOVENOX) injection 30 mg  30 mg Subcutaneous Q24H Belkys A Regalado, MD      . HYDROcodone-acetaminophen (NORCO/VICODIN) 5-325 MG per tablet 1-2 tablet  1-2 tablet Oral Q4H PRN Belkys A Regalado, MD      . metroNIDAZOLE (FLAGYL) IVPB 500 mg  500 mg Intravenous Q8H Belkys A Regalado, MD 100 mL/hr at 08/18/12 1406 500 mg at 08/18/12 1406  . morphine 2 MG/ML injection 1 mg  1 mg Intravenous Q4H PRN Belkys A Regalado, MD   1 mg at 08/18/12 0941  . ondansetron (ZOFRAN) tablet 4 mg  4 mg Oral Q6H PRN Belkys A Regalado, MD       Or  . ondansetron (ZOFRAN) injection 4 mg  4 mg Intravenous Q6H PRN Belkys A Regalado, MD      . sodium chloride 0.9 % bolus 500 mL  500 mL Intravenous Once Belkys A Regalado, MD   500 mL at 08/18/12 0925    Allergies as of 08/18/2012 - Review Complete 08/18/2012  Allergen Reaction Noted  . Shellfish-derived products  08/18/2012    History reviewed. No pertinent family history.  History   Social  History  . Marital Status: Married    Spouse Name: N/A    Number of Children: N/A  . Years of Education: N/A   Occupational History  . Not on file.   Social History Main Topics  . Smoking status: Never Smoker   . Smokeless tobacco: Not on file  . Alcohol Use: No  . Drug Use: No  . Sexually Active:    Other Topics Concern  . Not on file   Social History Narrative  . No narrative on file    Review of Systems: ROS Dr. Sunnie Nielsen 08/18/12 reviewed, and I agree  Physical Exam: Vital signs in last 24 hours: Temp:  [97.8 F (36.6 C)-99.9 F (37.7 C)] 97.8 F (36.6 C) (02/05 1500) Pulse Rate:  [110-123] 117  (02/05 1400) Resp:  [15-25] 17  (02/05 1500) BP:  (91-115)/(48-91) 110/62 mmHg (02/05 1400) SpO2:  [97 %-100 %] 98 % (02/05 1400) Weight:  [115 kg (253 lb 8.5 oz)] 115 kg (253 lb 8.5 oz) (02/05 1500) Last BM Date: 08/18/12 General:   Alert,  Overweight, Well-developed, well-nourished, pleasant and cooperative in NAD Head:  Normocephalic and atraumatic. Eyes:  Sclera clear, no icterus.   Conjunctiva pink. Ears:  Normal auditory acuity. Nose:  No deformity, discharge,  or lesions. Mouth:  No deformity or lesions.  Oropharynx pink but somewhat dry Neck:  Thick but supple; no masses or thyromegaly. Lungs:  Clear throughout to auscultation.   No wheezes, crackles, or rhonchi. No acute distress. Heart:  Regular rate and rhythm; no murmurs, clicks, rubs,  or gallops. Abdomen:  Soft, non-distended, mild generalized tenderness. No masses, hepatosplenomegaly or hernias noted. Hypoactive bowel sounds. Msk:  Symmetrical without gross deformities. Normal posture. Pulses:  Normal pulses noted. Extremities:  Without clubbing or edema. Neurologic:  Alert and  oriented x4;  Diffusely weak, otherwise grossly normal neurologically. Skin:  Intact without significant lesions or rashes. Psych:  Alert and cooperative. Normal mood and affect.   Lab Results:  Basename 08/18/12 0506 08/18/12 0446  WBC -- 11.1*  HGB 17.0* 15.7*  HCT 50.0* 46.6*  PLT -- 366   BMET  Basename 08/18/12 1108 08/18/12 0506 08/18/12 0446  NA 138 139 138  K 4.8 3.6 3.7  CL 106 102 99  CO2 25 -- 23  GLUCOSE 121* 136* 136*  BUN 13 18 17   CREATININE 0.86 1.20* 1.04  CALCIUM 7.8* -- 9.6   LFT  Basename 08/18/12 0446  PROT 7.0  ALBUMIN 3.1*  AST 24  ALT 21  ALKPHOS 107  BILITOT 0.4  BILIDIR --  IBILI --   PT/INR No results found for this basename: LABPROT:2,INR:2 in the last 72 hours  Studies/Results: US Abdomen Complete  08/18/2012  *RADIOLOGY REPORT*  Clinical Data:  Abdominal pain and vomiting.  ABDOMEN ULTRASOUND  Technique:  Complete abdominal ultrasound  examination was performed including evaluation of the liver, gallbladder, bile ducts, pancreas, kidneys, spleen, IVC, and abdominal aorta.  Comparison:  None  Findings:  Gallbladder:  No shadowing gallstones or echogenic sludge.  No gallbladder wall thickening or pericholecystic fluid.  Negative sonographic Murphy's sign according to the ultrasound technologist.  Common Bile Duct:  Normal caliber of 3 mm.  Liver:  A homogeneously hyperechoic nodule in the right lobe of the liver near the gallbladder fossa measures 1.6 cm in diameter.  This has the typical sonographic appearance of a a hemangioma.  No other liver lesions are identified.  IVC:  Patent throughout its visualized course in the abdomen.  Pancreas:  Although the pancreas is difficult to visualize in its entirety, no focal pancreatic abnormality is identified.  Spleen:  The spleen is of normal echotexture and size.  Kidneys:  Right kidney measures 11.1 cm and left kidney 12 cm. Both kidneys have a normal sonographic appearance.  Abdominal Aorta:  Normal caliber abdominal aorta.  IMPRESSION: No acute findings by ultrasound.  Probable small hemangioma of the liver near the gallbladder fossa.   Original Report Authenticated By: Irish Lack, M.D.    Ct Abdomen Pelvis W Contrast  08/18/2012  *RADIOLOGY REPORT*  Clinical Data: Abdominal pain with nausea, vomiting and diarrhea. Probable pancreatitis.  CT ABDOMEN AND PELVIS WITH CONTRAST  Technique:  Multidetector CT imaging of the abdomen and pelvis was performed following the standard protocol during bolus administration of intravenous contrast.  Contrast: 80mL OMNIPAQUE IOHEXOL 300 MG/ML  SOLN  Comparison: Abdominal ultrasound same date.  Findings: The lung bases are clear.  There is no pleural effusion. A 1 cm low density liver lesion adjacent to the gallbladder on image 21 demonstrates peripheral discontinuous enhancement and corresponds with an echogenic nodule on ultrasound, typical of a hemangioma.   There are ill-defined areas of increased density within the right lobe of the liver on images 12 and 18, also likely incidental vascular lesions.  No suspicious liver lesions are identified.  The gallbladder, biliary system and pancreas appear normal.  There is no evidence of peripancreatic inflammatory change or fluid collection.  The spleen and adrenal glands appear normal.  There are bilateral renal calculi.  In addition, there is a 5 mm calculus within the right renal pelvis on image 29.  There is no significant hydronephrosis or asymmetric perinephric soft tissue stranding.  In the upper pole of the right kidney is a 9 mm cyst on image 23.  The stomach and proximal small bowel appear normal.  The mid-to- distal small bowel demonstrates diffuse fibrofatty thickening without apparent skip lesions or surrounding inflammatory change. The colon is incompletely distended, and this may account for mild wall thickening of the transverse and descending colon.  The appendix appears normal.  There is no focal extraluminal fluid collection.  The uterus and ovaries appear normal.  A small amount of free pelvic fluid is within physiologic limits.  The urinary bladder appears normal.  Small mesenteric lymph nodes are not pathologically enlarged.  The osseous structures appear unremarkable.  IMPRESSION:  1.  No CT evidence of pancreatitis. 2.  Diffuse abnormality in the mid to small bowel with fibrofatty thickening most consistent with a chronic inflammatory process. The diffuse nature would be atypical for Crohn's disease.  Consider vasculitis or collagen vascular disease.  There is possible mild transverse and descending colonic wall thickening.  3.  Bilateral renal calculi with 5 mm calculus in the right renal pelvis may.  This may be intermittently obstructing, although with the patient supine, there is no hydronephrosis or perinephric soft tissue stranding. 4.  Incidental liver lesions, at least one typical of a  hemangioma. The other lesions are less specific, but likely incidental vascular lesions as well.   Original Report Authenticated By: Carey Bullocks, M.D.    Dg Chest Portable 1 View  08/18/2012  *RADIOLOGY REPORT*  Clinical Data: Nausea vomiting diarrhea.  Short of breath  PORTABLE CHEST - 1 VIEW  Comparison: None  Findings: Cardiac enlargement with normal pulmonary vascularity. Lungs are clear without infiltrate or effusion.  Negative for mass lesion.  IMPRESSION: No acute cardiopulmonary abnormality.   Original Report  Authenticated By: Janeece Riggers, M.D.     Impression:  1.  Acute onset abdominal pain and diarrhea.   2.  Abnormal CT abdomen, mid- to distal small bowel thickening, as well as non-specific colonic thickening.  Overall constellation of symptoms most typical of infectious process.  Crohn's disease or other inflammatory process (vasculitis, etc.) are much less likely, but not completely excluded.  Doubt her symptoms are related to her non-obstructive right-sided pelvic renal stone.  Plan:  1.  GI pathogen panel. 2.  Supportive care with antiemetics, IVF. 3.  If symptoms improve with conservative management, wouldn't do any endoscopic evaluation.  If, on the other hand, symptoms worsen, and stool studies unrevealing, consider colonoscopy (mindful that we will highly likely not be able to see any of the small bowel findings on the scan, but could at least assess the distal ileum as well as the colon (where some non-specific thickening was likewise seen). 4.  Clear liquid diet. 5.  Will follow; thank you for the consult.   LOS: 0 days   Galit Urich M  08/18/2012, 3:39 PM

## 2012-08-18 NOTE — H&P (Signed)
[facility] History and Physical  Jackalyn Bohan XBJ:[id number] DOB: [birth year] DOA: 08/18/2012  Referring physician: Dr Theodoro Kalata. PCP: REES,MICHAEL, MD  Specialists: None.  Chief Complaint: Abdominal pain.   HPI: Christine Rowe is a 37 y.o. female with no significant PMH presents complaining of abdominal pain that started 1 day prior to admission. She describes pain as sharp, constant, epigastric, RUQ area. It is accompanied by nausea, vomiting and diarrhea. She has had 4 episodes of watery stool. No blood in the stool. She denies alcohol. Husband relates that day prior to admission they decide to start eating healthy. Patient had similar episode of abdominal pain mid January. She came to ED and was diagnosed with colitis. Husband relates that episodes complete resolved and she was doing well. Patient started with same symptoms day prior to admission. She was seen in the ED on the 28 th of this month for allergic episode to scallops.  Patient was taking prednisone for allergic reaction. Suppose to finish today.   Review of Systems: The patient denies anorexia, , weight loss,, vision loss, decreased hearing, hoarseness, chest pain,  dyspnea on exertion, peripheral edema, balance deficits, hemoptysis, melena, hematochezia,, hematuria, incontinence, genital sores, muscle weakness, suspicious skin lesions, transient blindness, difficulty walking, depression, unusual weight change, abnormal bleeding.   Past Medical History  Diagnosis Date  . Colitis    Past Surgical History  Procedure Date  . Cesarean section   . Tubal ligation    Social History:  reports that she has never smoked. She does not have any smokeless tobacco history on file. She reports that she does not drink alcohol or use illicit drugs. She is substitute Runner, broadcasting/film/video for [employer]. She has 4 Childrens. She is married.    No Known Allergies: Shellfish.  Family History: Mother: Diabetes. Father: HTN. No history of Chronhs or  inflammatory bowel diseases.   Prior to Admission medications   Medication Sig Start Date End Date Taking? Authorizing Provider  diphenhydrAMINE (BENADRYL) 25 MG tablet Take 1 tablet (25 mg total) by mouth every 6 (six) hours. 08/10/12  Yes Glynn Octave, MD  famotidine (PEPCID) 20 MG tablet Take 1 tablet (20 mg total) by mouth 2 (two) times daily. 08/10/12  Yes Glynn Octave, MD  predniSONE (DELTASONE) 50 MG tablet 1 tablet PO daily 08/10/12  Yes Glynn Octave, MD  promethazine (PHENERGAN) 25 MG tablet Take 1 tablet (25 mg total) by mouth every 6 (six) hours as needed for nausea. 07/26/12  Yes Sunnie Nielsen, MD  EPINEPHrine (EPIPEN) 0.3 mg/0.3 mL DEVI Inject 0.3 mLs (0.3 mg total) into the muscle as needed. 08/10/12   Glynn Octave, MD   Physical Exam: Filed Vitals:   08/18/12 0507 08/18/12 0527 08/18/12 0530 08/18/12 0700  BP:  91/64 102/69 107/91  Pulse:  123  118  Temp: 99.9 F (37.7 C)     TempSrc: Rectal     Resp:  23 22 25   SpO2:    100%   General Appearance:    Alert, cooperative, no distress, appears stated age  Head:    Normocephalic, without obvious abnormality, atraumatic  Eyes:    PERRL, conjunctiva/corneas clear, EOM's intact, fundi    benign, both eyes  Ears:    Normal TM's and external ear canals, both ears  Nose:   Nares normal, septum midline, mucosa normal, no drainage    or sinus tenderness  Throat:   Lips, mucosa, and tongue dry; teeth and gums normal  Neck:   Supple, symmetrical, trachea midline, no adenopathy;  thyroid:  no enlargement/tenderness/nodules; no carotid   bruit or JVD  Back:     Symmetric, no curvature, ROM normal, no CVA tenderness  Lungs:     Clear to auscultation bilaterally, respirations unlabored  Chest Wall:    No tenderness or deformity   Heart:    Regular rate and rhythm, S1 and S2 normal, no murmur, rub   or gallop     Abdomen:     Soft, tender epigastric and right upper quadrant, bowel sounds active all four quadrants,    no  masses, no organomegaly        Extremities:   Extremities normal, atraumatic, no cyanosis or edema  Pulses:   2+ and symmetric all extremities  Skin:   Skin color, texture, turgor normal, no rashes or lesions  Lymph nodes:   Cervical, supraclavicular, and axillary nodes normal  Neurologic:   CNII-XII intact, normal strength, sensation and reflexes    throughout      Labs on Admission:  Basic Metabolic Panel:  Lab 08/18/12 1478 08/18/12 0446  NA 139 138  K 3.6 3.7  CL 102 99  CO2 -- 23  GLUCOSE 136* 136*  BUN 18 17  CREATININE 1.20* 1.04  CALCIUM -- 9.6  MG -- --  PHOS -- --   Liver Function Tests:  Lab 08/18/12 0446  AST 24  ALT 21  ALKPHOS 107  BILITOT 0.4  PROT 7.0  ALBUMIN 3.1*    Lab 08/18/12 0446  LIPASE 439*  AMYLASE --   CBC:  Lab 08/18/12 0506 08/18/12 0446  WBC -- 11.1*  NEUTROABS -- --  HGB 17.0* 15.7*  HCT 50.0* 46.6*  MCV -- 76.3*  PLT -- 366   Cardiac Enzymes: No results found for this basename: CKTOTAL:5,CKMB:5,CKMBINDEX:5,TROPONINI:5 in the last 168 hours  BNP (last 3 results) No results found for this basename: PROBNP:3 in the last 8760 hours CBG: No results found for this basename: GLUCAP:5 in the last 168 hours  Radiological Exams on Admission: Dg Chest Portable 1 View  08/18/2012  *RADIOLOGY REPORT*  Clinical Data: Nausea vomiting diarrhea.  Short of breath  PORTABLE CHEST - 1 VIEW  Comparison: None  Findings: Cardiac enlargement with normal pulmonary vascularity. Lungs are clear without infiltrate or effusion.  Negative for mass lesion.  IMPRESSION: No acute cardiopulmonary abnormality.   Original Report Authenticated By: Janeece Riggers, M.D.     Assessment/Plan Active Problems:  Pancreatitis, acute  SIRS (systemic inflammatory response syndrome)  Renal insufficiency  Diarrhea  1-Sepsis/ SIRS: Patient presents with leukocytosis, tachycardia, tachypnea, Hypotension. Lactic acid elevated. Probably related to pancreatitis, colitis.  Will provide supportive care. IV fluids, IV antibiotics. Will order cortisol level. SBP now 110 range. If hypotension reoccurs will consult CCM.   2-Abdominal pain, diarrhea: this could be secondary to pancreatitis, colitis. Lactic acid elevated, ischemia is in he differential. NPO status. IV fluids. IV antibiotics ciprofloxacin and flagyl. CT scan order. [place] pending. Will check triglycerides level. Will check for C diff, stool culture ordered. Will order ANA.   3-Renal Insufficiency. Very mild. Dehydration, sepsis. IV fluids.       Code:  Family Communication: Husband at bedside care discussed with him. Disposition Plan: Expect more than 5 days.   Time spent: more tham 70 minutes.   Braylie Badami [facility] Pager [phone]  If 7PM-7AM, please contact night-coverage [email or web address] Password [email or web address] 08/18/2012, 8:25 AM

## 2012-08-19 LAB — URINE CULTURE: Colony Count: NO GROWTH

## 2012-08-19 LAB — CBC
MCHC: 32.7 g/dL (ref 30.0–36.0)
RDW: 17 % — ABNORMAL HIGH (ref 11.5–15.5)
WBC: 16.5 10*3/uL — ABNORMAL HIGH (ref 4.0–10.5)

## 2012-08-19 LAB — FOLATE: Folate: 10.4 ng/mL

## 2012-08-19 LAB — RETICULOCYTES: Retic Count, Absolute: 63.9 10*3/uL (ref 19.0–186.0)

## 2012-08-19 LAB — COMPREHENSIVE METABOLIC PANEL
ALT: 16 U/L (ref 0–35)
Alkaline Phosphatase: 73 U/L (ref 39–117)
BUN: 6 mg/dL (ref 6–23)
CO2: 24 mEq/L (ref 19–32)
Calcium: 8 mg/dL — ABNORMAL LOW (ref 8.4–10.5)
GFR calc Af Amer: >90 mL/min (ref >90)
GFR calc non Af Amer: >90 mL/min (ref >90)
Glucose, Bld: 96 mg/dL (ref 70–99)
Potassium: 3.7 mEq/L (ref 3.5–5.1)
Sodium: 137 mEq/L (ref 135–145)
Total Protein: 5.5 g/dL — ABNORMAL LOW (ref 6.0–8.3)

## 2012-08-19 LAB — GI PATHOGEN PANEL BY PCR, STOOL
Campylobacter by PCR: NEGATIVE
E coli (ETEC) LT/ST: NEGATIVE
E coli (STEC): NEGATIVE
Norovirus GI/GII: NEGATIVE
Rotavirus A by PCR: NEGATIVE
Salmonella by PCR: NEGATIVE

## 2012-08-19 LAB — ANA: Anti Nuclear Antibody(ANA): NEGATIVE

## 2012-08-19 LAB — LIPASE, BLOOD: Lipase: 28 U/L (ref 11–59)

## 2012-08-19 LAB — ANTI-DNA ANTIBODY, DOUBLE-STRANDED: ds DNA Ab: 2 IU/mL (ref <30)

## 2012-08-19 LAB — VITAMIN B12: Vitamin B-12: 1034 pg/mL — ABNORMAL HIGH (ref 211–911)

## 2012-08-19 NOTE — Progress Notes (Signed)
Patient transferred to [id number] via wheelchair, report given to Chief Operating Officer.  All patients belongings transferred with patient.  Husband at bedside, oriented patient and husband to environment.

## 2012-08-19 NOTE — Progress Notes (Signed)
[facility] PROGRESS NOTE  Christine Rowe ZOX:[id number] DOB: 01-29-1976 DOA: 08/18/2012 PCP: Houston Siren, MD  Assessment/Plan:  1-Sepsis/ SIRS: Patient presents with leukocytosis, tachycardia, tachypnea, Hypotension. Lactic acid elevated. Probably related to pancreatitis, colitis. Hypotension probably related to decrease volume secondary to diarrhea. supportive care. IV fluids, IV antibiotics. cortisol level at 33. Blood pressure stable. Will transfer patient to telemetry. Repeat lactic acid pending.   2-Abdominal pain, diarrhea: this could be secondary to pancreatitis, colitis.  Continue with IV fluids. IV antibiotics ciprofloxacin and flagyl day 2..  CT scan show: Diffuse abnormality in the mid to small bowel with fibrofatty thickening most consistent with a chronic inflammatory process. [place]: No acute findings by ultrasound. Probable small hemangioma of the liver near the gallbladder fossa. ESR: 12, Triglycerides normal.  C diff negative, stool culture pending.   ANA and work up for lupus pending.   Appreciate dr Dulce Sellar help.   3-Renal Insufficiency. Very mild. Dehydration, sepsis. IV fluids. Improved.  4-Kidney stone: Monitor renal function. [place] negative for hydronephrosis. Urine culture negative. Follow up as out patient discussed with urologist on call. 5-Anemia: will check anemia panel. Likely hemoconcentration on admission. I will hold Lovenox due to Guaiac stool positive for occult blood.    Code Status: Full Code. Family Communication: Husband at bedside, care discussed with him. Disposition Plan: Home when stable.    Consultants:  Dr Dulce Sellar, GI  Procedures:  [place]:   Antibiotics: Ciprofloxacin 2-5 Flagyl 02-05  HPI/Subjective: Feeling better. Only one BM today. Yesterday multiple BM.   Objective: Filed Vitals:   08/19/12 0015 08/19/12 0355 08/19/12 0500 08/19/12 0745  BP:  116/60  107/74  Pulse:  112  104  Temp: 97.9 F (36.6 C)   98.5 F (36.9 C)   TempSrc: Oral   Oral  Resp:      Height:      Weight:   115.3 kg (254 lb 3.1 oz)   SpO2:  98%  98%    Intake/Output Summary (Last 24 hours) at 08/19/12 0810 Last data filed at 08/19/12 0600  Gross per 24 hour  Intake   3700 ml  Output   1050 ml  Net   2650 ml   Filed Weights   08/18/12 1500 08/19/12 0500  Weight: 115 kg (253 lb 8.5 oz) 115.3 kg (254 lb 3.1 oz)    Exam:   General:  No distress  Cardiovascular: S1 ,S 2 RRR  Respiratory: CTA  Abdomen: BS present, soft, mild tender.  Data Reviewed: Basic Metabolic Panel:  Lab 08/19/12 8119 08/18/12 1535 08/18/12 1108 08/18/12 0506 08/18/12 0446  NA 137 -- 138 139 138  K 3.7 -- 4.8 3.6 3.7  CL 106 -- 106 102 99  CO2 24 -- 25 -- 23  GLUCOSE 96 -- 121* 136* 136*  BUN 6 -- 13 18 17   CREATININE 0.65 0.70 0.86 1.20* 1.04  CALCIUM 8.0* -- 7.8* -- 9.6  MG -- -- -- -- --  PHOS -- -- -- -- --   Liver Function Tests:  Lab 08/19/12 0525 08/18/12 0446  AST 19 24  ALT 16 21  ALKPHOS 73 107  BILITOT 0.4 0.4  PROT 5.5* 7.0  ALBUMIN 2.5* 3.1*    Lab 08/18/12 0446  LIPASE 439*  AMYLASE --   No results found for this basename: AMMONIA:5 in the last 168 hours CBC:  Lab 08/19/12 0525 08/18/12 1535 08/18/12 0506 08/18/12 0446  WBC 16.5* 24.3* -- 11.1*  NEUTROABS -- -- -- --  HGB 9.9*  11.7* 17.0* 15.7*  HCT 30.3* 35.2* 50.0* 46.6*  MCV 75.6* 75.1* -- 76.3*  PLT 285 344 -- 366   Cardiac Enzymes: No results found for this basename: CKTOTAL:5,CKMB:5,CKMBINDEX:5,TROPONINI:5 in the last 168 hours BNP (last 3 results) No results found for this basename: PROBNP:3 in the last 8760 hours CBG: No results found for this basename: GLUCAP:5 in the last 168 hours  Recent Results (from the past 240 hour(s))  STOOL CULTURE     Status: Normal (Preliminary result)   Collection Time   08/18/12  4:56 AM      Component Value Range Status Comment   Specimen Description STOOL   Final    Special Requests NONE   Final    Culture  Culture reincubated for better growth   Final    Report Status PENDING   Incomplete   CLOSTRIDIUM DIFFICILE BY PCR     Status: Normal   Collection Time   08/18/12  8:32 AM      Component Value Range Status Comment   C difficile by pcr NEGATIVE  NEGATIVE Final   URINE CULTURE     Status: Normal   Collection Time   08/18/12 11:15 AM      Component Value Range Status Comment   Specimen Description URINE, CLEAN CATCH   Final    Special Requests NONE   Final    Culture  Setup Time 08/18/2012 12:09   Final    Colony Count NO GROWTH   Final    Culture NO GROWTH   Final    Report Status 08/19/2012 FINAL   Final   MRSA PCR SCREENING     Status: Normal   Collection Time   08/18/12  2:58 PM      Component Value Range Status Comment   MRSA by PCR NEGATIVE  NEGATIVE Final      Studies: US Abdomen Complete  08/18/2012  *RADIOLOGY REPORT*  Clinical Data:  Abdominal pain and vomiting.  ABDOMEN ULTRASOUND  Technique:  Complete abdominal ultrasound examination was performed including evaluation of the liver, gallbladder, bile ducts, pancreas, kidneys, spleen, IVC, and abdominal aorta.  Comparison:  None  Findings:  Gallbladder:  No shadowing gallstones or echogenic sludge.  No gallbladder wall thickening or pericholecystic fluid.  Negative sonographic Murphy's sign according to the ultrasound technologist.  Common Bile Duct:  Normal caliber of 3 mm.  Liver:  A homogeneously hyperechoic nodule in the right lobe of the liver near the gallbladder fossa measures 1.6 cm in diameter.  This has the typical sonographic appearance of a a hemangioma.  No other liver lesions are identified.  IVC:  Patent throughout its visualized course in the abdomen.  Pancreas:  Although the pancreas is difficult to visualize in its entirety, no focal pancreatic abnormality is identified.  Spleen:  The spleen is of normal echotexture and size.  Kidneys:  Right kidney measures 11.1 cm and left kidney 12 cm. Both kidneys have a normal  sonographic appearance.  Abdominal Aorta:  Normal caliber abdominal aorta.  IMPRESSION: No acute findings by ultrasound.  Probable small hemangioma of the liver near the gallbladder fossa.   Original Report Authenticated By: Irish Lack, M.D.    Ct Abdomen Pelvis W Contrast  08/18/2012  *RADIOLOGY REPORT*  Clinical Data: Abdominal pain with nausea, vomiting and diarrhea. Probable pancreatitis.  CT ABDOMEN AND PELVIS WITH CONTRAST  Technique:  Multidetector CT imaging of the abdomen and pelvis was performed following the standard protocol during bolus administration of intravenous contrast.  Contrast: 80mL OMNIPAQUE IOHEXOL 300 MG/ML  SOLN  Comparison: Abdominal ultrasound same date.  Findings: The lung bases are clear.  There is no pleural effusion. A 1 cm low density liver lesion adjacent to the gallbladder on image 21 demonstrates peripheral discontinuous enhancement and corresponds with an echogenic nodule on ultrasound, typical of a hemangioma.  There are ill-defined areas of increased density within the right lobe of the liver on images 12 and 18, also likely incidental vascular lesions.  No suspicious liver lesions are identified.  The gallbladder, biliary system and pancreas appear normal.  There is no evidence of peripancreatic inflammatory change or fluid collection.  The spleen and adrenal glands appear normal.  There are bilateral renal calculi.  In addition, there is a 5 mm calculus within the right renal pelvis on image 29.  There is no significant hydronephrosis or asymmetric perinephric soft tissue stranding.  In the upper pole of the right kidney is a 9 mm cyst on image 23.  The stomach and proximal small bowel appear normal.  The mid-to- distal small bowel demonstrates diffuse fibrofatty thickening without apparent skip lesions or surrounding inflammatory change. The colon is incompletely distended, and this may account for mild wall thickening of the transverse and descending colon.  The  appendix appears normal.  There is no focal extraluminal fluid collection.  The uterus and ovaries appear normal.  A small amount of free pelvic fluid is within physiologic limits.  The urinary bladder appears normal.  Small mesenteric lymph nodes are not pathologically enlarged.  The osseous structures appear unremarkable.  IMPRESSION:  1.  No CT evidence of pancreatitis. 2.  Diffuse abnormality in the mid to small bowel with fibrofatty thickening most consistent with a chronic inflammatory process. The diffuse nature would be atypical for Crohn's disease.  Consider vasculitis or collagen vascular disease.  There is possible mild transverse and descending colonic wall thickening.  3.  Bilateral renal calculi with 5 mm calculus in the right renal pelvis may.  This may be intermittently obstructing, although with the patient supine, there is no hydronephrosis or perinephric soft tissue stranding. 4.  Incidental liver lesions, at least one typical of a hemangioma. The other lesions are less specific, but likely incidental vascular lesions as well.   Original Report Authenticated By: Carey Bullocks, M.D.    Dg Chest Portable 1 View  08/18/2012  *RADIOLOGY REPORT*  Clinical Data: Nausea vomiting diarrhea.  Short of breath  PORTABLE CHEST - 1 VIEW  Comparison: None  Findings: Cardiac enlargement with normal pulmonary vascularity. Lungs are clear without infiltrate or effusion.  Negative for mass lesion.  IMPRESSION: No acute cardiopulmonary abnormality.   Original Report Authenticated By: Janeece Riggers, M.D.     Scheduled Meds:   . ciprofloxacin  400 mg Intravenous Q12H  . enoxaparin (LOVENOX) injection  0.5 mg/kg Subcutaneous Q24H  . metronidazole  500 mg Intravenous Q8H  . sodium chloride  500 mL Intravenous Once   Continuous Infusions:   . sodium chloride 150 mL/hr at 08/19/12 0403    Active Problems:  Pancreatitis, acute  SIRS (systemic inflammatory response syndrome)  Renal insufficiency   Diarrhea    Time spent: 30 minutes.    Christine Rowe  [facility] Pager [phone] If 8PM-8AM, please contact night-coverage at [email or web address], password [facility] 08/19/2012, 8:10 AM  LOS: 1 day

## 2012-08-19 NOTE — Progress Notes (Signed)
Subjective: "I feel much better." Less abdominal pain, diarrhea, nausea. Tolerating clear liquids  Objective: Vital signs in last 24 hours: Temp:  [97.8 F (36.6 C)-98.8 F (37.1 C)] 98.5 F (36.9 C) (02/06 0745) Pulse Rate:  [102-117] 104  (02/06 0745) Resp:  [3-20] 18  (02/05 2100) BP: (95-130)/(48-80) 107/74 mmHg (02/06 0745) SpO2:  [97 %-100 %] 98 % (02/06 0745) FiO2 (%):  [2 %] 2 % (02/05 2000) Weight:  [115 kg (253 lb 8.5 oz)-115.3 kg (254 lb 3.1 oz)] 115.3 kg (254 lb 3.1 oz) (02/06 0500) Weight change:  Last BM Date: 08/19/12  PE: GEN:  NAD ABD:  Soft  Lab Results: C diff PCR negative; other stool cultures pending  Assessment:  1.  Abdominal pain, improving. 2.  Nausea, vomiting, diarrhea, improving. 3.  Abnormal CT abdomen.  Overall constellation of findings most typical of infectious enterocolitis.  Crohn's disease is less likely possibility.  Plan:  1.  Advance diet. 2.  I am equivocal about need for antibiotics; if continued, would transition to po soon and do 10-day total course of therapy. 3.  If patient tolerates advance in diet and continues to do well, she might be able to be discharged tomorrow. 4.  Will Follow.   Freddy Jaksch 08/19/2012, 9:25 AM

## 2012-08-20 DIAGNOSIS — R197 Diarrhea, unspecified: Secondary | ICD-10-CM

## 2012-08-20 DIAGNOSIS — K859 Acute pancreatitis without necrosis or infection, unspecified: Secondary | ICD-10-CM

## 2012-08-20 LAB — BASIC METABOLIC PANEL
CO2: 28 mEq/L (ref 19–32)
Calcium: 9.4 mg/dL (ref 8.4–10.5)
Glucose, Bld: 93 mg/dL (ref 70–99)
Sodium: 137 mEq/L (ref 135–145)

## 2012-08-20 LAB — CBC
Hemoglobin: 11.2 g/dL — ABNORMAL LOW (ref 12.0–15.0)
MCH: 25 pg — ABNORMAL LOW (ref 26.0–34.0)
RBC: 4.48 MIL/uL (ref 3.87–5.11)

## 2012-08-20 MED ORDER — CIPROFLOXACIN HCL 500 MG PO TABS
500.0000 mg | ORAL_TABLET | Freq: Two times a day (BID) | ORAL | Status: DC
  Filled 2012-08-20 (×3): qty 1

## 2012-08-20 MED ORDER — RISAQUAD PO CAPS
1.0000 | ORAL_CAPSULE | Freq: Every day | ORAL | Status: DC
  Filled 2012-08-20: qty 1

## 2012-08-20 MED ORDER — METRONIDAZOLE 500 MG PO TABS: 500.0000 mg | ORAL_TABLET | Freq: Three times a day (TID) | ORAL | Status: DC

## 2012-08-20 MED ORDER — CIPROFLOXACIN HCL 500 MG PO TABS: 500.0000 mg | ORAL_TABLET | Freq: Two times a day (BID) | ORAL | Status: DC

## 2012-08-20 MED ORDER — RISAQUAD PO CAPS: 1.0000 | ORAL_CAPSULE | Freq: Every day | ORAL | Status: DC

## 2012-08-20 MED ORDER — METRONIDAZOLE 500 MG PO TABS
500.0000 mg | ORAL_TABLET | Freq: Three times a day (TID) | ORAL | Status: DC
  Filled 2012-08-20 (×3): qty 1

## 2012-08-20 MED ORDER — ONDANSETRON 4 MG PO TBDP: 4.0000 mg | ORAL_TABLET | Freq: Three times a day (TID) | ORAL | Status: DC | PRN

## 2012-08-20 NOTE — Discharge Summary (Signed)
Physician Discharge Summary  Christine Rowe AVW:[id number] DOB: 09-10-1975 DOA: 08/18/2012  PCP: Houston Siren, MD  Admit date: 08/18/2012 Discharge date: 08/20/2012  Time spent: 35 minutes  Recommendations for Outpatient Follow-up:  1. Needs to follow up with Dr Out law for further evaluation of symptoms.  2. Follow up with urology for kidney stone.   Discharge Diagnoses:   Colitis.  Pancreatitis, acute  SIRS (systemic inflammatory response syndrome)  Renal insufficiency  Diarrhea Kidney stone.   Discharge Condition: Improved.  Diet recommendation: general.  Filed Weights   08/18/12 1500 08/19/12 0500 08/20/12 0430  Weight: 115 kg (253 lb 8.5 oz) 115.3 kg (254 lb 3.1 oz) 113 kg (249 lb 1.9 oz)    History of present illness:  Christine Rowe is a 37 y.o. female with no significant PMH presents complaining of abdominal pain that started 1 day prior to admission. She describes pain as sharp, constant, epigastric, RUQ area. It is accompanied by nausea, vomiting and diarrhea. She has had 4 episodes of watery stool. No blood in the stool. She denies alcohol. Husband relates that day prior to admission they decide to start eating healthy. Patient had similar episode of abdominal pain mid January. She came to ED and was diagnosed with colitis. Husband relates that episodes complete resolved and she was doing well. Patient started with same symptoms day prior to admission. She was seen in the ED on the 28 th of this month for allergic episode to scallops.  Patient was taking prednisone for allergic reaction. Suppose to finish today.    Hospital Course:  1-Sepsis/ SIRS: Patient presents with leukocytosis, tachycardia, tachypnea, Hypotension. Lactic acid elevated. Probably related to pancreatitis, colitis. Hypotension probably related to decrease volume secondary to diarrhea. supportive care. IV fluids, IV antibiotics. cortisol level at 33. Blood pressure stable.  Repeat lactic acid  normalized.  2-Abdominal pain, diarrhea: this could be secondary to pancreatitis, colitis.  Continue with IV fluids. Patient was treated with IV antibiotics ciprofloxacin and flagyl day. She will finish total of 7 days. Her symptoms has improved.  CT scan show: Diffuse abnormality in the mid to small bowel with fibrofatty thickening most consistent with a chronic inflammatory process. [place]: No acute findings by ultrasound. Probable small hemangioma of the liver near the gallbladder fossa. ESR: 12, Triglycerides normal. C diff negative, stool culture negative. ANA and work up for lupus negative.   3-Renal Insufficiency. Very mild. Dehydration, sepsis. IV fluids. Improved.  4-Kidney stone: Monitor renal function. [place] negative for hydronephrosis. Urine culture negative. Follow up as out patient discussed with urologist on call.  5-Anemia: will check anemia panel. Likely hemoconcentration on admission. I will hold Lovenox due to Guaiac stool positive for occult blood. Needs to follow with PCP.    Procedures: [place]:  No acute findings by ultrasound. Probable small hemangioma of the liver near the gallbladder fossa.   Consultations:  Dr Dulce Sellar.  Discharge Exam: Filed Vitals:   08/19/12 1545 08/19/12 1757 08/19/12 2231 08/20/12 0430  BP: 127/91 122/89 136/92 129/82  Pulse: 103 101 97 93  Temp: 98.5 F (36.9 C) 98.5 F (36.9 C) 98.3 F (36.8 C) 98.5 F (36.9 C)  TempSrc: Oral Oral Oral Oral  Resp: 20 20 20 19   Height:      Weight:    113 kg (249 lb 1.9 oz)  SpO2: 98% 98% 99% 99%    General: no distress. Cardiovascular: S 1, S 2 RRR Respiratory: CTA Abdomen: Soft, NT, ND.  Discharge Instructions  Medication List     As of 08/20/2012  1:04 PM    STOP taking these medications         famotidine 20 MG tablet   Commonly known as: PEPCID      predniSONE 50 MG tablet   Commonly known as: DELTASONE      promethazine 25 MG tablet   Commonly known as: PHENERGAN      TAKE these  medications         acidophilus Caps   Take 1 capsule by mouth daily.      ciprofloxacin 500 MG tablet   Commonly known as: CIPRO   Take 1 tablet (500 mg total) by mouth 2 (two) times daily.      diphenhydrAMINE 25 MG tablet   Commonly known as: BENADRYL   Take 1 tablet (25 mg total) by mouth every 6 (six) hours.      EPINEPHrine 0.3 mg/0.3 mL Devi   Commonly known as: EPI-PEN   Inject 0.3 mLs (0.3 mg total) into the muscle as needed.      metroNIDAZOLE 500 MG tablet   Commonly known as: FLAGYL   Take 1 tablet (500 mg total) by mouth every 8 (eight) hours.      ondansetron 4 MG disintegrating tablet   Commonly known as: ZOFRAN-ODT   Take 1 tablet (4 mg total) by mouth every 8 (eight) hours as needed for nausea.          The results of significant diagnostics from this hospitalization (including imaging, microbiology, ancillary and laboratory) are listed below for reference.    Significant Diagnostic Studies: US Abdomen Complete  08/18/2012  *RADIOLOGY REPORT*  Clinical Data:  Abdominal pain and vomiting.  ABDOMEN ULTRASOUND  Technique:  Complete abdominal ultrasound examination was performed including evaluation of the liver, gallbladder, bile ducts, pancreas, kidneys, spleen, IVC, and abdominal aorta.  Comparison:  None  Findings:  Gallbladder:  No shadowing gallstones or echogenic sludge.  No gallbladder wall thickening or pericholecystic fluid.  Negative sonographic Murphy's sign according to the ultrasound technologist.  Common Bile Duct:  Normal caliber of 3 mm.  Liver:  A homogeneously hyperechoic nodule in the right lobe of the liver near the gallbladder fossa measures 1.6 cm in diameter.  This has the typical sonographic appearance of a a hemangioma.  No other liver lesions are identified.  IVC:  Patent throughout its visualized course in the abdomen.  Pancreas:  Although the pancreas is difficult to visualize in its entirety, no focal pancreatic abnormality is identified.   Spleen:  The spleen is of normal echotexture and size.  Kidneys:  Right kidney measures 11.1 cm and left kidney 12 cm. Both kidneys have a normal sonographic appearance.  Abdominal Aorta:  Normal caliber abdominal aorta.  IMPRESSION: No acute findings by ultrasound.  Probable small hemangioma of the liver near the gallbladder fossa.   Original Report Authenticated By: Irish Lack, M.D.    Ct Abdomen Pelvis W Contrast  08/18/2012  *RADIOLOGY REPORT*  Clinical Data: Abdominal pain with nausea, vomiting and diarrhea. Probable pancreatitis.  CT ABDOMEN AND PELVIS WITH CONTRAST  Technique:  Multidetector CT imaging of the abdomen and pelvis was performed following the standard protocol during bolus administration of intravenous contrast.  Contrast: 80mL OMNIPAQUE IOHEXOL 300 MG/ML  SOLN  Comparison: Abdominal ultrasound same date.  Findings: The lung bases are clear.  There is no pleural effusion. A 1 cm low density liver lesion adjacent to the gallbladder on image 21 demonstrates  peripheral discontinuous enhancement and corresponds with an echogenic nodule on ultrasound, typical of a hemangioma.  There are ill-defined areas of increased density within the right lobe of the liver on images 12 and 18, also likely incidental vascular lesions.  No suspicious liver lesions are identified.  The gallbladder, biliary system and pancreas appear normal.  There is no evidence of peripancreatic inflammatory change or fluid collection.  The spleen and adrenal glands appear normal.  There are bilateral renal calculi.  In addition, there is a 5 mm calculus within the right renal pelvis on image 29.  There is no significant hydronephrosis or asymmetric perinephric soft tissue stranding.  In the upper pole of the right kidney is a 9 mm cyst on image 23.  The stomach and proximal small bowel appear normal.  The mid-to- distal small bowel demonstrates diffuse fibrofatty thickening without apparent skip lesions or surrounding  inflammatory change. The colon is incompletely distended, and this may account for mild wall thickening of the transverse and descending colon.  The appendix appears normal.  There is no focal extraluminal fluid collection.  The uterus and ovaries appear normal.  A small amount of free pelvic fluid is within physiologic limits.  The urinary bladder appears normal.  Small mesenteric lymph nodes are not pathologically enlarged.  The osseous structures appear unremarkable.  IMPRESSION:  1.  No CT evidence of pancreatitis. 2.  Diffuse abnormality in the mid to small bowel with fibrofatty thickening most consistent with a chronic inflammatory process. The diffuse nature would be atypical for Crohn's disease.  Consider vasculitis or collagen vascular disease.  There is possible mild transverse and descending colonic wall thickening.  3.  Bilateral renal calculi with 5 mm calculus in the right renal pelvis may.  This may be intermittently obstructing, although with the patient supine, there is no hydronephrosis or perinephric soft tissue stranding. 4.  Incidental liver lesions, at least one typical of a hemangioma. The other lesions are less specific, but likely incidental vascular lesions as well.   Original Report Authenticated By: Carey Bullocks, M.D.    Dg Chest Portable 1 View  08/18/2012  *RADIOLOGY REPORT*  Clinical Data: Nausea vomiting diarrhea.  Short of breath  PORTABLE CHEST - 1 VIEW  Comparison: None  Findings: Cardiac enlargement with normal pulmonary vascularity. Lungs are clear without infiltrate or effusion.  Negative for mass lesion.  IMPRESSION: No acute cardiopulmonary abnormality.   Original Report Authenticated By: Janeece Riggers, M.D.     Microbiology: Recent Results (from the past 240 hour(s))  STOOL CULTURE     Status: Normal (Preliminary result)   Collection Time   08/18/12  4:56 AM      Component Value Range Status Comment   Specimen Description STOOL   Final    Special Requests NONE    Final    Culture     Final    Value: NO SUSPICIOUS COLONIES, CONTINUING TO HOLD     Note: REDUCED NORMAL FLORA PRESENT   Report Status PENDING   Incomplete   CULTURE, BLOOD (ROUTINE X 2)     Status: Normal (Preliminary result)   Collection Time   08/18/12  8:24 AM      Component Value Range Status Comment   Specimen Description BLOOD RIGHT ANTECUBITAL   Final    Special Requests BOTTLES DRAWN AEROBIC AND ANAEROBIC 10CC   Final    Culture  Setup Time 08/18/2012 15:17   Final    Culture     Final  Value:        BLOOD CULTURE RECEIVED NO GROWTH TO DATE CULTURE WILL BE HELD FOR 5 DAYS BEFORE ISSUING A FINAL NEGATIVE REPORT   Report Status PENDING   Incomplete   CLOSTRIDIUM DIFFICILE BY PCR     Status: Normal   Collection Time   08/18/12  8:32 AM      Component Value Range Status Comment   C difficile by pcr NEGATIVE  NEGATIVE Final   CULTURE, BLOOD (ROUTINE X 2)     Status: Normal (Preliminary result)   Collection Time   08/18/12  8:45 AM      Component Value Range Status Comment   Specimen Description BLOOD HAND LEFT   Final    Special Requests BOTTLES DRAWN AEROBIC ONLY 4CC   Final    Culture  Setup Time 08/18/2012 15:16   Final    Culture     Final    Value:        BLOOD CULTURE RECEIVED NO GROWTH TO DATE CULTURE WILL BE HELD FOR 5 DAYS BEFORE ISSUING A FINAL NEGATIVE REPORT   Report Status PENDING   Incomplete   URINE CULTURE     Status: Normal   Collection Time   08/18/12 11:15 AM      Component Value Range Status Comment   Specimen Description URINE, CLEAN CATCH   Final    Special Requests NONE   Final    Culture  Setup Time 08/18/2012 12:09   Final    Colony Count NO GROWTH   Final    Culture NO GROWTH   Final    Report Status 08/19/2012 FINAL   Final   MRSA PCR SCREENING     Status: Normal   Collection Time   08/18/12  2:58 PM      Component Value Range Status Comment   MRSA by PCR NEGATIVE  NEGATIVE Final      Labs: Basic Metabolic Panel:  Lab 08/20/12 0454 08/19/12  0525 08/18/12 1535 08/18/12 1108 08/18/12 0506 08/18/12 0446  NA 137 137 -- 138 139 138  K 3.7 3.7 -- 4.8 3.6 3.7  CL 101 106 -- 106 102 99  CO2 28 24 -- 25 -- 23  GLUCOSE 93 96 -- 121* 136* 136*  BUN 4* 6 -- 13 18 17   CREATININE 0.68 0.65 0.70 0.86 1.20* --  CALCIUM 9.4 8.0* -- 7.8* -- 9.6  MG -- -- -- -- -- --  PHOS -- -- -- -- -- --   Liver Function Tests:  Lab 08/19/12 0525 08/18/12 0446  AST 19 24  ALT 16 21  ALKPHOS 73 107  BILITOT 0.4 0.4  PROT 5.5* 7.0  ALBUMIN 2.5* 3.1*    Lab 08/19/12 0823 08/18/12 0446  LIPASE 28 439*  AMYLASE -- --   No results found for this basename: AMMONIA:5 in the last 168 hours CBC:  Lab 08/20/12 0630 08/19/12 0525 08/18/12 1535 08/18/12 0506 08/18/12 0446  WBC 8.7 16.5* 24.3* -- 11.1*  NEUTROABS -- -- -- -- --  HGB 11.2* 9.9* 11.7* 17.0* 15.7*  HCT 34.1* 30.3* 35.2* 50.0* 46.6*  MCV 76.1* 75.6* 75.1* -- 76.3*  PLT 316 285 344 -- 366   Cardiac Enzymes: No results found for this basename: CKTOTAL:5,CKMB:5,CKMBINDEX:5,TROPONINI:5 in the last 168 hours BNP: BNP (last 3 results) No results found for this basename: PROBNP:3 in the last 8760 hours CBG: No results found for this basename: GLUCAP:5 in the last 168 hours     Signed:  Jupiter Kabir  [facility] 08/20/2012, 1:04 PM

## 2012-08-20 NOTE — Progress Notes (Signed)
Subjective: Diarrhea much less frequent. Abdominal pain much improved. Tolerating diet.  Objective: Vital signs in last 24 hours: Temp:  [98.3 F (36.8 C)-98.5 F (36.9 C)] 98.5 F (36.9 C) (02/07 0430) Pulse Rate:  [93-103] 93  (02/07 0430) Resp:  [19-20] 19  (02/07 0430) BP: (122-136)/(82-92) 129/82 mmHg (02/07 0430) SpO2:  [98 %-99 %] 99 % (02/07 0430) Weight:  [113 kg (249 lb 1.9 oz)] 113 kg (249 lb 1.9 oz) (02/07 0430) Weight change: -2 kg (-4 lb 6.5 oz) Last BM Date: 08/20/12  PE: GEN:  NAD, overweight ABD:  Soft  Lab Results: GI pathogen panel negative C diff PCR negative. CBC    Component Value Date/Time   WBC 8.7 08/20/2012 0630   RBC 4.48 08/20/2012 0630   HGB 11.2* 08/20/2012 0630   HCT 34.1* 08/20/2012 0630   PLT 316 08/20/2012 0630   MCV 76.1* 08/20/2012 0630   MCH 25.0* 08/20/2012 0630   MCHC 32.8 08/20/2012 0630   RDW 16.9* 08/20/2012 0630   LYMPHSABS 2.3 07/26/2012 0450   MONOABS 0.5 07/26/2012 0450   EOSABS 0.0 07/26/2012 0450   BASOSABS 0.0 07/26/2012 0450   CMP     Component Value Date/Time   NA 137 08/20/2012 0630   K 3.7 08/20/2012 0630   CL 101 08/20/2012 0630   CO2 28 08/20/2012 0630   GLUCOSE 93 08/20/2012 0630   BUN 4* 08/20/2012 0630   CREATININE 0.68 08/20/2012 0630   CALCIUM 9.4 08/20/2012 0630   PROT 5.5* 08/19/2012 0525   ALBUMIN 2.5* 08/19/2012 0525   AST 19 08/19/2012 0525   ALT 16 08/19/2012 0525   ALKPHOS 73 08/19/2012 0525   BILITOT 0.4 08/19/2012 0525   GFRNONAA >90 08/20/2012 0630   GFRAA >90 08/20/2012 0630   Assessment:  1.  Abdominal pain, diarrhea.  Suspect infectious process, despite negative stool studies. 2.  Abnormal CT abdomen.  Suspect infectious process.  Crohn's et al seems less likely, but can't be definitively excluded.  Plan:  1.  I am equivocal about need for antibiotics; if antibiotics are used, would have patient take probiotics (florastor 250 mg bid) as long as she is on antibiotic therapy. 2.  OK to discharge today from GI perspective; patient  can follow-up with me at [facility] on as-needed basis.  Please provide her with our [facility] office number [phone]) upon discharge. 3.  Will sign-off; please call with questions; thank you for the consult.   Christine Rowe 08/20/2012, 12:53 PM

## 2012-08-20 NOTE — Progress Notes (Signed)
No changes noted since AM assessment.  IV DC.  DC instructions given to patient, along with 4 Rxs, and a work note for the patient's husband.  Careful review of the med rec was done.  Pt denies further questions or concerns at this time.  Her ride will be here after 3pm.

## 2012-08-21 LAB — STOOL CULTURE

## 2012-08-24 LAB — CULTURE, BLOOD (ROUTINE X 2): Culture: NO GROWTH

## 2012-09-22 ENCOUNTER — Emergency Department (HOSPITAL_COMMUNITY): Payer: Medicaid Other

## 2012-09-22 ENCOUNTER — Emergency Department (HOSPITAL_COMMUNITY)
Admission: EM | Admit: 2012-09-22 | Discharge: 2012-09-22 | Disposition: A | Discharge status: Home / Self Care | Payer: Medicaid Other | Attending: Emergency Medicine | Admitting: Emergency Medicine

## 2012-09-22 ENCOUNTER — Encounter (HOSPITAL_COMMUNITY): Payer: Self-pay | Admitting: *Deleted

## 2012-09-22 DIAGNOSIS — R110 Nausea: Secondary | ICD-10-CM | POA: Insufficient documentation

## 2012-09-22 DIAGNOSIS — Z3202 Encounter for pregnancy test, result negative: Secondary | ICD-10-CM | POA: Insufficient documentation

## 2012-09-22 DIAGNOSIS — Z9889 Other specified postprocedural states: Secondary | ICD-10-CM | POA: Insufficient documentation

## 2012-09-22 DIAGNOSIS — N200 Calculus of kidney: Secondary | ICD-10-CM | POA: Insufficient documentation

## 2012-09-22 DIAGNOSIS — R197 Diarrhea, unspecified: Secondary | ICD-10-CM | POA: Insufficient documentation

## 2012-09-22 DIAGNOSIS — Z79899 Other long term (current) drug therapy: Secondary | ICD-10-CM | POA: Insufficient documentation

## 2012-09-22 DIAGNOSIS — Z9851 Tubal ligation status: Secondary | ICD-10-CM | POA: Insufficient documentation

## 2012-09-22 DIAGNOSIS — Z8719 Personal history of other diseases of the digestive system: Secondary | ICD-10-CM | POA: Insufficient documentation

## 2012-09-22 LAB — URINALYSIS, ROUTINE W REFLEX MICROSCOPIC
Bilirubin Urine: NEGATIVE
Nitrite: NEGATIVE
Protein, ur: NEGATIVE mg/dL
Specific Gravity, Urine: 1.025 (ref 1.005–1.030)
Urobilinogen, UA: 0.2 mg/dL (ref 0.0–1.0)

## 2012-09-22 LAB — PREGNANCY, URINE: Preg Test, Ur: NEGATIVE

## 2012-09-22 LAB — CBC WITH DIFFERENTIAL/PLATELET
Eosinophils Absolute: 0 10*3/uL (ref 0.0–0.7)
Eosinophils Relative: 1 % (ref 0–5)
Hemoglobin: 13.2 g/dL (ref 12.0–15.0)
Lymphs Abs: 2.9 10*3/uL (ref 0.7–4.0)
MCH: 25.9 pg — ABNORMAL LOW (ref 26.0–34.0)
MCV: 75.2 fL — ABNORMAL LOW (ref 78.0–100.0)
Monocytes Absolute: 0.3 10*3/uL (ref 0.1–1.0)
Monocytes Relative: 4 % (ref 3–12)
RBC: 5.09 MIL/uL (ref 3.87–5.11)

## 2012-09-22 LAB — COMPREHENSIVE METABOLIC PANEL
BUN: 13 mg/dL (ref 6–23)
Calcium: 10 mg/dL (ref 8.4–10.5)
GFR calc Af Amer: >90 mL/min (ref >90)
Glucose, Bld: 109 mg/dL — ABNORMAL HIGH (ref 70–99)
Total Protein: 7.3 g/dL (ref 6.0–8.3)

## 2012-09-22 LAB — LIPASE, BLOOD: Lipase: 49 U/L (ref 11–59)

## 2012-09-22 LAB — URINE MICROSCOPIC-ADD ON

## 2012-09-22 MED ORDER — ONDANSETRON 4 MG PO TBDP: 8.0000 mg | ORAL_TABLET | Freq: Three times a day (TID) | ORAL | Status: DC | PRN

## 2012-09-22 MED ORDER — OXYCODONE-ACETAMINOPHEN 5-325 MG PO TABS: 1.0000 | ORAL_TABLET | ORAL | Status: DC | PRN

## 2012-09-22 MED ORDER — TAMSULOSIN HCL 0.4 MG PO CAPS
0.4000 mg | ORAL_CAPSULE | Freq: Once | ORAL | Status: AC
  Administered 2012-09-22: 0.4 mg via ORAL
  Filled 2012-09-22: qty 1

## 2012-09-22 MED ORDER — ONDANSETRON HCL 4 MG/2ML IJ SOLN
4.0000 mg | Freq: Once | INTRAMUSCULAR | Status: AC
Start: 2012-09-22 — End: 2012-09-22
  Administered 2012-09-22: 4 mg via INTRAVENOUS
  Filled 2012-09-22: qty 2

## 2012-09-22 MED ORDER — KETOROLAC TROMETHAMINE 30 MG/ML IJ SOLN
30.0000 mg | Freq: Once | INTRAMUSCULAR | Status: AC
  Administered 2012-09-22: 30 mg via INTRAVENOUS
  Filled 2012-09-22: qty 1

## 2012-09-22 MED ORDER — HYDROMORPHONE HCL PF 1 MG/ML IJ SOLN
1.0000 mg | Freq: Once | INTRAMUSCULAR | Status: AC
  Administered 2012-09-22: 1 mg via INTRAVENOUS
  Filled 2012-09-22: qty 1

## 2012-09-22 MED ORDER — TAMSULOSIN HCL 0.4 MG PO CAPS: 0.4000 mg | ORAL_CAPSULE | Freq: Two times a day (BID) | ORAL | Status: DC

## 2012-09-22 MED ORDER — SODIUM CHLORIDE 0.9 % IV SOLN
Freq: Once | INTRAVENOUS | Status: AC
  Administered 2012-09-22: 05:00:00 via INTRAVENOUS

## 2012-09-22 NOTE — ED Provider Notes (Signed)
6:09 AM Patient signed out to me by Earley Favor, NP. Patient pending abdominal CT. If results unremarkable, patient will be discharged.   6:40 AM CT shows a 4mm uretal stone. Patient will be treated with flomax, pain medication and a recommended follow up with urology. Urinalysis shows no UTI but I will order a urine culture. Patient will be contacted with results if she needs treatment. Patient informed of results and is agreeable to plan. Vitals stable for discharge.  Emilia Beck, PA-C 09/22/12 [phone]

## 2012-09-22 NOTE — ED Notes (Signed)
Mid abd pain today diarrhea nausea.  No vomiting lmp feb 15

## 2012-09-22 NOTE — ED Provider Notes (Signed)
Medical screening examination/treatment/procedure(s) were performed by non-physician practitioner and as supervising physician I was immediately available for consultation/collaboration.  Olivia Mackie, MD 09/22/12 [phone]

## 2012-09-22 NOTE — ED Provider Notes (Signed)
Medical screening examination/treatment/procedure(s) were performed by non-physician practitioner and as supervising physician I was immediately available for consultation/collaboration.  Olivia Mackie, MD 09/22/12 [phone]

## 2012-09-22 NOTE — ED Notes (Signed)
Patient given socks and warm blankets per request.

## 2012-09-22 NOTE — ED Provider Notes (Signed)
History     CSN: [id number]  Arrival date & time 09/22/12  0247   First MD Initiated Contact with Patient 09/22/12 0455      Chief Complaint  Patient presents with  . Abdominal Pain    (Consider location/radiation/quality/duration/timing/severity/associated sxs/prior treatment) HPI Comments: Recently hospitalized for "intestinal inflammation and renal stone was DC home with antibiotic which she completed 1 week ago.  Was to FU but did not due to financed, now having LLQ pain loose stools and nausea for the past several hours.   Patient is a 37 y.o. female presenting with abdominal pain. The history is provided by the patient.  Abdominal Pain Pain location:  LLQ Pain quality: stabbing   Pain severity:  Moderate Onset quality:  Sudden Progression:  Worsening Chronicity:  Recurrent Associated symptoms: diarrhea   Associated symptoms: no fever, no hematuria and no vomiting     Past Medical History  Diagnosis Date  . Colitis   . No pertinent past medical history     Past Surgical History  Procedure Laterality Date  . Cesarean section    . Tubal ligation      No family history on file.  History  Substance Use Topics  . Smoking status: Never Smoker   . Smokeless tobacco: Not on file  . Alcohol Use: No    OB History   Grav Para Term Preterm Abortions TAB SAB Ect Mult Living                  Review of Systems  Constitutional: Negative for fever.  Gastrointestinal: Positive for abdominal pain and diarrhea. Negative for vomiting and abdominal distention.  Genitourinary: Negative for hematuria.  Neurological: Negative for weakness.  All other systems reviewed and are negative.    Allergies  Shellfish-derived products  Home Medications   Current Outpatient Rx  Name  Route  Sig  Dispense  Refill  . acidophilus (RISAQUAD) CAPS   Oral   Take 1 capsule by mouth daily.   10 capsule   0   . ibuprofen (ADVIL,MOTRIN) 200 MG tablet   Oral   Take 400 mg by  mouth every 6 (six) hours as needed for pain.         Marland Kitchen ondansetron (ZOFRAN ODT) 4 MG disintegrating tablet   Oral   Take 1 tablet (4 mg total) by mouth every 8 (eight) hours as needed for nausea.   20 tablet   0   . Pseudoephedrine HCl (SUDAFED PO)   Oral   Take 1 tablet by mouth every 6 (six) hours as needed (nasal congestion).         . ciprofloxacin (CIPRO) 500 MG tablet   Oral   Take 1 tablet (500 mg total) by mouth 2 (two) times daily.   8 tablet   0   . metroNIDAZOLE (FLAGYL) 500 MG tablet   Oral   Take 1 tablet (500 mg total) by mouth every 8 (eight) hours.   8 tablet   0     BP 118/79  Pulse 106  Temp(Src) 98.5 F (36.9 C)  Resp 22  LMP 09/03/2012  Physical Exam  Constitutional: She appears well-developed and well-nourished.  Morbidly obese  HENT:  Head: Normocephalic.  Eyes: Pupils are equal, round, and reactive to light.  Neck: Normal range of motion.  Cardiovascular: Normal rate.   Pulmonary/Chest: Effort normal.  Abdominal: Soft. Bowel sounds are normal. She exhibits no distension. There is tenderness in the left lower quadrant. There is  no rigidity and no guarding.  Musculoskeletal: Normal range of motion.  Neurological: She is alert.  Skin: Skin is warm and dry.    ED Course  Procedures (including critical care time)  Labs Reviewed  CBC WITH DIFFERENTIAL - Abnormal; Notable for the following:    MCV 75.2 (*)    MCH 25.9 (*)    RDW 16.4 (*)    All other components within normal limits  COMPREHENSIVE METABOLIC PANEL - Abnormal; Notable for the following:    Glucose, Bld 109 (*)    Albumin 3.2 (*)    Total Bilirubin 0.2 (*)    GFR calc non Af Amer 85 (*)    All other components within normal limits  URINALYSIS, ROUTINE W REFLEX MICROSCOPIC - Abnormal; Notable for the following:    Hgb urine dipstick MODERATE (*)    Leukocytes, UA SMALL (*)    All other components within normal limits  URINE MICROSCOPIC-ADD ON - Abnormal; Notable for  the following:    Squamous Epithelial / LPF FEW (*)    Bacteria, UA FEW (*)    All other components within normal limits  LIPASE, BLOOD  PREGNANCY, URINE   No results found.   No diagnosis found.    MDM  Concern for renal stone movement will get noncon CT scan as well as labs IV fluids pain and nausea control        Arman Filter, NP 09/22/12 [phone]

## 2012-09-23 LAB — URINE CULTURE: Culture: NO GROWTH

## 2012-10-28 ENCOUNTER — Emergency Department (HOSPITAL_COMMUNITY)
Admission: EM | Admit: 2012-10-28 | Discharge: 2012-10-28 | Disposition: A | Discharge status: Home / Self Care | Payer: Medicaid Other | Attending: Emergency Medicine | Admitting: Emergency Medicine

## 2012-10-28 ENCOUNTER — Emergency Department (HOSPITAL_COMMUNITY): Payer: Medicaid Other

## 2012-10-28 ENCOUNTER — Encounter (HOSPITAL_COMMUNITY): Payer: Self-pay | Admitting: Emergency Medicine

## 2012-10-28 DIAGNOSIS — S79919A Unspecified injury of unspecified hip, initial encounter: Secondary | ICD-10-CM | POA: Insufficient documentation

## 2012-10-28 DIAGNOSIS — S6990XA Unspecified injury of unspecified wrist, hand and finger(s), initial encounter: Secondary | ICD-10-CM | POA: Insufficient documentation

## 2012-10-28 DIAGNOSIS — S335XXA Sprain of ligaments of lumbar spine, initial encounter: Secondary | ICD-10-CM | POA: Insufficient documentation

## 2012-10-28 DIAGNOSIS — S99919A Unspecified injury of unspecified ankle, initial encounter: Secondary | ICD-10-CM | POA: Insufficient documentation

## 2012-10-28 DIAGNOSIS — Z8719 Personal history of other diseases of the digestive system: Secondary | ICD-10-CM | POA: Insufficient documentation

## 2012-10-28 DIAGNOSIS — S59909A Unspecified injury of unspecified elbow, initial encounter: Secondary | ICD-10-CM | POA: Insufficient documentation

## 2012-10-28 DIAGNOSIS — Y929 Unspecified place or not applicable: Secondary | ICD-10-CM | POA: Insufficient documentation

## 2012-10-28 DIAGNOSIS — S8990XA Unspecified injury of unspecified lower leg, initial encounter: Secondary | ICD-10-CM | POA: Insufficient documentation

## 2012-10-28 DIAGNOSIS — S39012A Strain of muscle, fascia and tendon of lower back, initial encounter: Secondary | ICD-10-CM

## 2012-10-28 DIAGNOSIS — S99929A Unspecified injury of unspecified foot, initial encounter: Secondary | ICD-10-CM | POA: Insufficient documentation

## 2012-10-28 DIAGNOSIS — Y939 Activity, unspecified: Secondary | ICD-10-CM | POA: Insufficient documentation

## 2012-10-28 MED ORDER — HYDROCODONE-ACETAMINOPHEN 5-325 MG PO TABS: 1.0000 | ORAL_TABLET | Freq: Four times a day (QID) | ORAL | Status: DC | PRN

## 2012-10-28 MED ORDER — IBUPROFEN 800 MG PO TABS: 800.0000 mg | ORAL_TABLET | Freq: Three times a day (TID) | ORAL | Status: DC | PRN
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
## 2012-10-28 NOTE — ED Provider Notes (Signed)
Medical screening examination/treatment/procedure(s) were performed by non-physician practitioner and as supervising physician I was immediately available for consultation/collaboration.   Gavin Pound. Oletta Lamas, MD 10/28/12 2116
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
## 2013-05-17 ENCOUNTER — Encounter (HOSPITAL_COMMUNITY): Payer: Self-pay | Admitting: Emergency Medicine

## 2013-05-17 ENCOUNTER — Emergency Department (HOSPITAL_COMMUNITY)
Admission: EM | Admit: 2013-05-17 | Discharge: 2013-05-17 | Disposition: A | Discharge status: Home / Self Care | Payer: No Typology Code available for payment source | Attending: Emergency Medicine | Admitting: Emergency Medicine

## 2013-05-17 DIAGNOSIS — Z3202 Encounter for pregnancy test, result negative: Secondary | ICD-10-CM | POA: Insufficient documentation

## 2013-05-17 DIAGNOSIS — N898 Other specified noninflammatory disorders of vagina: Secondary | ICD-10-CM | POA: Insufficient documentation

## 2013-05-17 DIAGNOSIS — Z8719 Personal history of other diseases of the digestive system: Secondary | ICD-10-CM | POA: Insufficient documentation

## 2013-05-17 DIAGNOSIS — A499 Bacterial infection, unspecified: Secondary | ICD-10-CM | POA: Insufficient documentation

## 2013-05-17 DIAGNOSIS — B373 Candidiasis of vulva and vagina: Secondary | ICD-10-CM | POA: Insufficient documentation

## 2013-05-17 DIAGNOSIS — R110 Nausea: Secondary | ICD-10-CM | POA: Insufficient documentation

## 2013-05-17 DIAGNOSIS — R6883 Chills (without fever): Secondary | ICD-10-CM | POA: Insufficient documentation

## 2013-05-17 DIAGNOSIS — B3731 Acute candidiasis of vulva and vagina: Secondary | ICD-10-CM | POA: Insufficient documentation

## 2013-05-17 DIAGNOSIS — N390 Urinary tract infection, site not specified: Secondary | ICD-10-CM | POA: Insufficient documentation

## 2013-05-17 DIAGNOSIS — B379 Candidiasis, unspecified: Secondary | ICD-10-CM

## 2013-05-17 DIAGNOSIS — N760 Acute vaginitis: Secondary | ICD-10-CM | POA: Insufficient documentation

## 2013-05-17 DIAGNOSIS — B9689 Other specified bacterial agents as the cause of diseases classified elsewhere: Secondary | ICD-10-CM | POA: Insufficient documentation

## 2013-05-17 LAB — URINALYSIS, ROUTINE W REFLEX MICROSCOPIC
Bilirubin Urine: NEGATIVE
Nitrite: NEGATIVE
Specific Gravity, Urine: 1.016 (ref 1.005–1.030)
Urobilinogen, UA: 0.2 mg/dL (ref 0.0–1.0)

## 2013-05-17 LAB — URINE MICROSCOPIC-ADD ON

## 2013-05-17 LAB — WET PREP, GENITAL

## 2013-05-17 MED ORDER — HYDROCODONE-ACETAMINOPHEN 5-325 MG PO TABS
1.0000 | ORAL_TABLET | Freq: Once | ORAL | Status: AC
  Administered 2013-05-17: 1 via ORAL
  Filled 2013-05-17: qty 1

## 2013-05-17 MED ORDER — NITROFURANTOIN MONOHYD MACRO 100 MG PO CAPS: 100.0000 mg | ORAL_CAPSULE | Freq: Two times a day (BID) | ORAL | Status: DC

## 2013-05-17 MED ORDER — METRONIDAZOLE 500 MG PO TABS: 500.0000 mg | ORAL_TABLET | Freq: Two times a day (BID) | ORAL | Status: DC

## 2013-05-17 MED ORDER — IBUPROFEN 800 MG PO TABS: 800.0000 mg | ORAL_TABLET | Freq: Three times a day (TID) | ORAL | Status: DC

## 2013-05-17 MED ORDER — HYDROCODONE-ACETAMINOPHEN 5-325 MG PO TABS: 1.0000 | ORAL_TABLET | ORAL | Status: DC | PRN

## 2013-05-17 MED ORDER — IBUPROFEN 400 MG PO TABS
600.0000 mg | ORAL_TABLET | Freq: Once | ORAL | Status: AC
  Administered 2013-05-17: 600 mg via ORAL
  Filled 2013-05-17 (×2): qty 1

## 2013-05-17 MED ORDER — FLUCONAZOLE 150 MG PO TABS: 150.0000 mg | ORAL_TABLET | Freq: Once | ORAL | Status: DC

## 2013-05-17 NOTE — ED Provider Notes (Signed)
TIME SEEN: 5:53 PM  CHIEF COMPLAINT: Back pain, urinary frequency and urgency  HPI: Patient is a 37 year old G4 P3 A1 who presents the emergency department with one week of lower back pain that feels like a crampy pain. She states she's also had urinary frequency urgency, chills, nausea.  ROS: See HPI Constitutional: no fever  Eyes: no drainage  ENT: no runny nose   Cardiovascular:  no chest pain  Resp: no SOB  GI: no vomiting GU: no dysuria Integumentary: no rash  Allergy: no hives  Musculoskeletal: no leg swelling  Neurological: no slurred speech ROS otherwise negative  PAST MEDICAL HISTORY/PAST SURGICAL HISTORY:  Past Medical History  Diagnosis Date  . Colitis   . No pertinent past medical history     MEDICATIONS:  Prior to Admission medications   Medication Sig Start Date End Date Taking? Authorizing Provider  ibuprofen (ADVIL,MOTRIN) 200 MG tablet Take 400 mg by mouth every 8 (eight) hours as needed for pain.    Yes Historical Provider, MD    ALLERGIES:  Allergies  Allergen Reactions  . Shellfish-Derived Products Anaphylaxis    SOCIAL HISTORY:  History  Substance Use Topics  . Smoking status: Never Smoker   . Smokeless tobacco: Not on file  . Alcohol Use: No    FAMILY HISTORY: History reviewed. No pertinent family history.  EXAM: BP 129/78  Pulse 71  Temp(Src) 98.3 F (36.8 C) (Oral)  Resp 18  SpO2 98%  LMP 04/04/2013 CONSTITUTIONAL: Alert and oriented and responds appropriately to questions. Well-appearing; well-nourished HEAD: Normocephalic EYES: Conjunctivae clear, PERRL ENT: normal nose; no rhinorrhea; moist mucous membranes; pharynx without lesions noted NECK: Supple, no meningismus, no LAD  CARD: RRR; S1 and S2 appreciated; no murmurs, no clicks, no rubs, no gallops RESP: Normal chest excursion without splinting or tachypnea; breath sounds clear and equal bilaterally; no wheezes, no rhonchi, no rales,  ABD/GI: Normal bowel sounds;  non-distended; soft, non-tender, no rebound, no guarding; no midline spinal tenderness, step-off or deformity GU:  Normal external genitalia, no cervical motion tenderness, no adnexal tenderness or fullness, no vaginal bleeding, minimal of thick, white vaginal discharge BACK:  The back appears normal and is non-tender to palpation, there is no CVA tenderness EXT: Normal ROM in all joints; non-tender to palpation; no edema; normal capillary refill; no cyanosis    SKIN: Normal color for age and race; warm NEURO: Moves all extremities equally PSYCH: The patient's mood and manner are appropriate. Grooming and personal hygiene are appropriate.  MEDICAL DECISION MAKING: Patient here with lower back pain, urinary frequency and urgency. Her abdominal exam is benign. She also reports that she has not had a menstrual period since September 23. We'll obtain urine, urine pregnancy test. If negative, patient will need pelvic exam with cultures. No history of back injury. No numbness, tingling or focal weakness. No bowel or bladder incontinence.  ED PROGRESS: Patient's urine shows moderate leukocytes but no other sign of infection. Urine culture pending. Pelvic exam revealed no cervical motion tenderness or adnexal tenderness. She had a minimal amount of thick, white vaginal discharge. Wet prep pending. Patient's back pain is improved after Vicodin and ibuprofen. Urine pregnancy test negative.   Wet prep is positive for clue cells and yeast. Will treat for urinary tract infection, bacterial vaginosis and yeast infection. Given return precautions, PCP followup. Patient verbalizes understanding is comfortable plan.  Christine Maw Keddrick Wyne, DO 05/17/13 2121

## 2013-05-17 NOTE — ED Notes (Signed)
Pt reports lower back pain x 1 week, states she is worried because she "didnt have a period this month and my tubes are tied." c/o urinary frequency and nausea as well this week

## 2013-05-18 LAB — GC/CHLAMYDIA PROBE AMP
CT Probe RNA: NEGATIVE
GC Probe RNA: NEGATIVE

## 2013-05-19 LAB — URINE CULTURE: Culture: NO GROWTH

## 2013-06-11 ENCOUNTER — Other Ambulatory Visit: Payer: Self-pay

## 2013-06-11 ENCOUNTER — Emergency Department (HOSPITAL_COMMUNITY): Payer: Self-pay

## 2013-06-11 ENCOUNTER — Emergency Department (HOSPITAL_COMMUNITY)
Admission: EM | Admit: 2013-06-11 | Discharge: 2013-06-11 | Disposition: A | Discharge status: Home / Self Care | Payer: Self-pay | Attending: Emergency Medicine | Admitting: Emergency Medicine

## 2013-06-11 ENCOUNTER — Encounter (HOSPITAL_COMMUNITY): Payer: Self-pay | Admitting: Emergency Medicine

## 2013-06-11 DIAGNOSIS — R000 Tachycardia, unspecified: Secondary | ICD-10-CM | POA: Insufficient documentation

## 2013-06-11 DIAGNOSIS — I319 Disease of pericardium, unspecified: Secondary | ICD-10-CM

## 2013-06-11 DIAGNOSIS — R0789 Other chest pain: Secondary | ICD-10-CM | POA: Insufficient documentation

## 2013-06-11 DIAGNOSIS — Z8719 Personal history of other diseases of the digestive system: Secondary | ICD-10-CM | POA: Insufficient documentation

## 2013-06-11 DIAGNOSIS — I309 Acute pericarditis, unspecified: Secondary | ICD-10-CM | POA: Insufficient documentation

## 2013-06-11 LAB — COMPREHENSIVE METABOLIC PANEL
AST: 18 U/L (ref 0–37)
Albumin: 3.7 g/dL (ref 3.5–5.2)
Alkaline Phosphatase: 109 U/L (ref 39–117)
BUN: 12 mg/dL (ref 6–23)
CO2: 27 mEq/L (ref 19–32)
Chloride: 103 mEq/L (ref 96–112)
Potassium: 4 mEq/L (ref 3.5–5.1)
Total Bilirubin: 0.3 mg/dL (ref 0.3–1.2)
Total Protein: 7.6 g/dL (ref 6.0–8.3)

## 2013-06-11 LAB — POCT I-STAT TROPONIN I: Troponin i, poc: 0 ng/mL (ref 0.00–0.08)

## 2013-06-11 LAB — CBC
Hemoglobin: 12 g/dL (ref 12.0–15.0)
RBC: 4.83 MIL/uL (ref 3.87–5.11)
WBC: 7.4 10*3/uL (ref 4.0–10.5)

## 2013-06-11 LAB — PRO B NATRIURETIC PEPTIDE: Pro B Natriuretic peptide (BNP): 7.1 pg/mL (ref 0–125)

## 2013-06-11 MED ORDER — IBUPROFEN 800 MG PO TABS: 800.0000 mg | ORAL_TABLET | Freq: Three times a day (TID) | ORAL | Status: DC

## 2013-06-11 MED ORDER — HYDROCODONE-ACETAMINOPHEN 5-325 MG PO TABS
2.0000 | ORAL_TABLET | Freq: Once | ORAL | Status: AC
  Administered 2013-06-11: 2 via ORAL
  Filled 2013-06-11: qty 2

## 2013-06-11 NOTE — ED Notes (Signed)
Patient transported to X-ray 

## 2013-06-11 NOTE — ED Notes (Signed)
Patient returned from X-ray 

## 2013-06-11 NOTE — ED Notes (Addendum)
Pt c/o sob and chest "tightness" x 3 days.  C/o increased edema to face and extremities.  Feels like throat is closing and feels as if she cannot get a full breath on inspiration.  1+ edema to bil LE.  Airway patent.  Also states she took a benadryl for angioedema when she woke up.

## 2013-06-11 NOTE — ED Provider Notes (Signed)
CSN: [id number]     Arrival date & time 06/11/13  1514 History   First MD Initiated Contact with Patient 06/11/13 1550     Chief Complaint  Patient presents with  . Shortness of Breath  . Chest Pain   (Consider location/radiation/quality/duration/timing/severity/associated sxs/prior Treatment) The history is provided by the patient and medical records. No language interpreter was used.    Christine Rowe is a 37 y.o. female  with a hx of pancreatitis presents to the Emergency Department complaining of gradual, persistent, progressively worsening chest tightness with associated SOB onset 3 days ago.  She reports her throat feels like it is closing, but this resolves after she rotates her neck.  She reports that she "cannot get a full breath" due to the heaviness in her chest. Pt reports discomfort and SOB are worse with lying and are better with leaning forward.  She denies hx of same or cardiac hx.  She denies smoking, recent surgery, travel or periods of immobilization.  She does note a flare of her pancreatitis after eating [employer] on Wednesday.  Pt denies fever, chills, headache, posterior neck pain, weakness, dizziness, syncope, dysuria, hematuria.  Pt denies abd pain, N/V/D after Wednesday.     Past Medical History  Diagnosis Date  . Colitis   . No pertinent past medical history    Past Surgical History  Procedure Laterality Date  . Cesarean section    . Tubal ligation     No family history on file. History  Substance Use Topics  . Smoking status: Never Smoker   . Smokeless tobacco: Not on file  . Alcohol Use: No   OB History   Grav Para Term Preterm Abortions TAB SAB Ect Mult Living                 Review of Systems  Constitutional: Negative for fever, diaphoresis, appetite change, fatigue and unexpected weight change.  HENT: Negative for mouth sores.   Eyes: Negative for visual disturbance.  Respiratory: Positive for chest tightness and shortness of breath. Negative  for cough and wheezing.   Cardiovascular: Negative for chest pain.  Gastrointestinal: Negative for nausea, vomiting, abdominal pain, diarrhea and constipation.  Endocrine: Negative for polydipsia, polyphagia and polyuria.  Genitourinary: Negative for dysuria, urgency, frequency and hematuria.  Musculoskeletal: Negative for back pain and neck stiffness.  Skin: Negative for rash.  Allergic/Immunologic: Negative for immunocompromised state.  Neurological: Negative for syncope, light-headedness and headaches.  Hematological: Does not bruise/bleed easily.  Psychiatric/Behavioral: Negative for sleep disturbance. The patient is not nervous/anxious.     Allergies  Shellfish-derived products  Home Medications   Current Outpatient Rx  Name  Route  Sig  Dispense  Refill  . diphenhydrAMINE (BENADRYL) 2 % cream   Topical   Apply 1 application topically 2 (two) times daily as needed for itching.         . diphenhydrAMINE (BENADRYL) 25 MG tablet   Oral   Take 25 mg by mouth every 6 (six) hours as needed for itching.         Marland Kitchen ibuprofen (ADVIL,MOTRIN) 800 MG tablet   Oral   Take 1 tablet (800 mg total) by mouth 3 (three) times daily.   21 tablet   0   . ibuprofen (ADVIL,MOTRIN) 800 MG tablet   Oral   Take 1 tablet (800 mg total) by mouth 3 (three) times daily.   21 tablet   0    BP 113/61  Pulse 84  Temp(Src) 98  F (36.7 C) (Oral)  Resp 20  Ht 5\' 1"  (1.549 m)  Wt 243 lb (110.224 kg)  BMI 45.94 kg/m2  SpO2 100%  LMP 05/22/2013 Physical Exam  Nursing note and vitals reviewed. Constitutional: She is oriented to person, place, and time. She appears well-developed and well-nourished. No distress.  Awake, alert, nontoxic appearance  HENT:  Head: Normocephalic and atraumatic.  Right Ear: Hearing, tympanic membrane, external ear and ear canal normal.  Left Ear: Hearing, tympanic membrane, external ear and ear canal normal.  Nose: Nose normal.  Mouth/Throat: Uvula is midline,  oropharynx is clear and moist and mucous membranes are normal. Mucous membranes are not dry and not cyanotic. No uvula swelling. No oropharyngeal exudate, posterior oropharyngeal edema, posterior oropharyngeal erythema or tonsillar abscesses.  No swelling of the lips or tongue  Eyes: Conjunctivae are normal. Pupils are equal, round, and reactive to light. No scleral icterus.  Neck: Normal range of motion. Neck supple.  Patent airway, handling secretions, no stridor  Cardiovascular: Normal rate, regular rhythm, normal heart sounds and intact distal pulses.   No murmur heard. No tachycardia No friction rub  Pulmonary/Chest: Effort normal and breath sounds normal. No respiratory distress. She has no wheezes.  Clear and equal  Abdominal: Soft. Bowel sounds are normal. She exhibits no distension and no mass. There is no tenderness. There is no rebound and no guarding.  Obese Soft and nontender  Musculoskeletal: Normal range of motion. She exhibits no edema.  No pitting edema of the extremities No aniscoria  Lymphadenopathy:    She has no cervical adenopathy.  Neurological: She is alert and oriented to person, place, and time. She exhibits normal muscle tone. Coordination normal.  Speech is clear and goal oriented Moves extremities without ataxia  Skin: Skin is warm and dry. She is not diaphoretic. No erythema.  No erythema No urticaria or hives  Psychiatric: She has a normal mood and affect.    ED Course  Procedures (including critical care time) Labs Review Labs Reviewed  CBC - Abnormal; Notable for the following:    MCV 74.9 (*)    MCH 24.8 (*)    RDW 16.2 (*)    All other components within normal limits  COMPREHENSIVE METABOLIC PANEL - Abnormal; Notable for the following:    GFR calc non Af Amer 88 (*)    All other components within normal limits  PRO B NATRIURETIC PEPTIDE  D-DIMER, QUANTITATIVE  POCT I-STAT TROPONIN I   Imaging Review Dg Chest 2 View  06/11/2013    CLINICAL DATA:  37 year old female with shortness of breath  EXAM: CHEST  2 VIEW  COMPARISON:  08/18/2012 chest radiograph  FINDINGS: The cardiomediastinal silhouette is unremarkable.  Mild peribronchial thickening and mild elevation of the right hemidiaphragm again noted.  There is no evidence of focal airspace disease, pulmonary edema, suspicious pulmonary nodule/mass, pleural effusion, or pneumothorax. No acute bony abnormalities are identified.  IMPRESSION: No evidence of acute cardiopulmonary disease.  Mild chronic peribronchial thickening.   Electronically Signed   By: Laveda Abbe M.D.   On: 06/11/2013 17:12    EKG Interpretation    Date/Time:  Saturday June 11 2013 15:18:40 EST Ventricular Rate:  102 PR Interval:  166 QRS Duration: 78 QT Interval:  336 QTC Calculation: 437 R Axis:   49 Text Interpretation:  Sinus tachycardia Otherwise normal ECG Confirmed by WARD  DO, KRISTEN ([id number]) on 06/11/2013 4:48:30 PM            MDM  1. Pericarditis      Christine Rowe presents for chest pain/tightness and shortness of breath.  Patient's symptoms are better when she leans forward worse when she took to lay flat. Chest pain is atypical in patient with minimal risk factors for PE or ACS.  PERC negative on exam (though sinus tach on arrival).  Will assess for PE, ACS, PNA.  Possible pericarditis.    Nursing note reviewed and patient questioned about report given to RN.  At this time se reports no history of angioedema and did not take Benadryl today.  She also denies swelling of her face or extremities.  She denies urticaria.  5:32 PM D-dimer negative, trop neg, cbc unremarkable, CMP unremarkable, CXR without evidence of infiltrate, BNP WNL and ECG nonischemic.  Will give pain control and reassess.    6:22 PM Pt with some improvement in symptoms. Pt history, physical and EKG consistent with pericarditis.  Upon gathering more history patient reports that she had viral URI illness at the  beginning of the week which resolved quickly and she thought nothing of it.  Patient is alert, oriented, nontoxic, nonseptic appearing.  Patient without hypoxia.  Recommend anti-inflammatories and close followup with primary care physician.  It has been determined that no acute conditions requiring further emergency intervention are present at this time. The patient/guardian have been advised of the diagnosis and plan. We have discussed signs and symptoms that warrant return to the ED, such as changes or worsening in symptoms.   Vital signs are stable at discharge.   BP 113/61  Pulse 84  Temp(Src) 98 F (36.7 C) (Oral)  Resp 20  Ht 5\' 1"  (1.549 m)  Wt 243 lb (110.224 kg)  BMI 45.94 kg/m2  SpO2 100%  LMP 05/22/2013  Patient/guardian has voiced understanding and agreed to follow-up with the PCP or specialist.          Dierdre Forth, PA-C 06/11/13 1823

## 2013-06-12 NOTE — ED Provider Notes (Signed)
Medical screening examination/treatment/procedure(s) were performed by non-physician practitioner and as supervising physician I was immediately available for consultation/collaboration.  EKG Interpretation    Date/Time:  Saturday June 11 2013 15:18:40 EST Ventricular Rate:  102 PR Interval:  166 QRS Duration: 78 QT Interval:  336 QTC Calculation: 437 R Axis:   49 Text Interpretation:  Sinus tachycardia Otherwise normal ECG Confirmed by Evie Croston  DO, Hasaan Radde ([id number]) on 06/11/2013 4:48:30 PM              Layla Maw Kacen Mellinger, DO 06/12/13 0105

## 2013-06-17 ENCOUNTER — Encounter (HOSPITAL_COMMUNITY): Payer: Self-pay | Admitting: Emergency Medicine

## 2013-06-17 ENCOUNTER — Emergency Department (HOSPITAL_COMMUNITY)
Admission: EM | Admit: 2013-06-17 | Discharge: 2013-06-17 | Disposition: A | Discharge status: Home / Self Care | Payer: No Typology Code available for payment source | Attending: Emergency Medicine | Admitting: Emergency Medicine

## 2013-06-17 DIAGNOSIS — R21 Rash and other nonspecific skin eruption: Secondary | ICD-10-CM | POA: Insufficient documentation

## 2013-06-17 DIAGNOSIS — Z8719 Personal history of other diseases of the digestive system: Secondary | ICD-10-CM | POA: Insufficient documentation

## 2013-06-17 DIAGNOSIS — R000 Tachycardia, unspecified: Secondary | ICD-10-CM | POA: Insufficient documentation

## 2013-06-17 DIAGNOSIS — I10 Essential (primary) hypertension: Secondary | ICD-10-CM | POA: Insufficient documentation

## 2013-06-17 DIAGNOSIS — R220 Localized swelling, mass and lump, head: Secondary | ICD-10-CM | POA: Insufficient documentation

## 2013-06-17 DIAGNOSIS — M7989 Other specified soft tissue disorders: Secondary | ICD-10-CM | POA: Insufficient documentation

## 2013-06-17 DIAGNOSIS — L299 Pruritus, unspecified: Secondary | ICD-10-CM | POA: Insufficient documentation

## 2013-06-17 DIAGNOSIS — R0989 Other specified symptoms and signs involving the circulatory and respiratory systems: Secondary | ICD-10-CM | POA: Insufficient documentation

## 2013-06-17 DIAGNOSIS — T7840XA Allergy, unspecified, initial encounter: Secondary | ICD-10-CM

## 2013-06-17 DIAGNOSIS — R0609 Other forms of dyspnea: Secondary | ICD-10-CM | POA: Insufficient documentation

## 2013-06-17 MED ORDER — DIPHENHYDRAMINE HCL 50 MG/ML IJ SOLN
50.0000 mg | Freq: Once | INTRAMUSCULAR | Status: AC
  Administered 2013-06-17: 50 mg via INTRAVENOUS
  Filled 2013-06-17: qty 1

## 2013-06-17 MED ORDER — FAMOTIDINE IN NACL 20-0.9 MG/50ML-% IV SOLN
20.0000 mg | Freq: Once | INTRAVENOUS | Status: AC
  Administered 2013-06-17: 20 mg via INTRAVENOUS
  Filled 2013-06-17: qty 50

## 2013-06-17 MED ORDER — FAMOTIDINE 20 MG PO TABS: 20.0000 mg | ORAL_TABLET | Freq: Two times a day (BID) | ORAL | Status: DC

## 2013-06-17 MED ORDER — PREDNISONE 10 MG PO TABS: 20.0000 mg | ORAL_TABLET | Freq: Every day | ORAL | Status: DC

## 2013-06-17 MED ORDER — METHYLPREDNISOLONE SODIUM SUCC 125 MG IJ SOLR
125.0000 mg | Freq: Once | INTRAMUSCULAR | Status: AC
  Administered 2013-06-17: 125 mg via INTRAVENOUS
  Filled 2013-06-17: qty 2

## 2013-06-17 MED ORDER — ONDANSETRON HCL 4 MG/2ML IJ SOLN
4.0000 mg | Freq: Once | INTRAMUSCULAR | Status: AC
  Administered 2013-06-17: 4 mg via INTRAVENOUS
  Filled 2013-06-17: qty 2

## 2013-06-17 NOTE — ED Notes (Signed)
Pt reports feeling better after doses of pepcid, solu-medrol, and benadryl.  Pt does not appear to be in any distress at the moment.

## 2013-06-17 NOTE — ED Notes (Signed)
Pt. woke up this evening with generalized body itching /  facial itching . Respirations unlabored / airway intact . Slight tongue swelling.

## 2013-06-17 NOTE — ED Provider Notes (Signed)
CSN: [id number]     Arrival date & time 06/17/13  0111 History   First MD Initiated Contact with Patient 06/17/13 0122     Chief Complaint  Patient presents with  . Allergic Reaction   (Consider location/radiation/quality/duration/timing/severity/associated sxs/prior Treatment) Patient is a 37 y.o. female presenting with allergic reaction. The history is provided by the patient.  Allergic Reaction  patient here complaining of diffuse body itching and swelling to her hands prior to arrival. History of multiple symptoms like this in the past without diagnosis. Notes that her heart is racing as well. Denies any toxic exposures. Denies any feeling of her throat closing. Slight dyspnea. No severe tongue swelling. No treatment used prior to arrival. She has been seen for this in the past. No new medications or chemicals. Symptoms have been progressively worse  Past Medical History  Diagnosis Date  . Colitis   . No pertinent past medical history    Past Surgical History  Procedure Laterality Date  . Cesarean section    . Tubal ligation     No family history on file. History  Substance Use Topics  . Smoking status: Never Smoker   . Smokeless tobacco: Not on file  . Alcohol Use: No   OB History   Grav Para Term Preterm Abortions TAB SAB Ect Mult Living                 Review of Systems  All other systems reviewed and are negative.    Allergies  Shellfish-derived products  Home Medications   Current Outpatient Rx  Name  Route  Sig  Dispense  Refill  . diphenhydrAMINE (BENADRYL) 2 % cream   Topical   Apply 1 application topically 2 (two) times daily as needed for itching.         . diphenhydrAMINE (BENADRYL) 25 MG tablet   Oral   Take 25 mg by mouth every 6 (six) hours as needed for itching.         Marland Kitchen ibuprofen (ADVIL,MOTRIN) 800 MG tablet   Oral   Take 1 tablet (800 mg total) by mouth 3 (three) times daily.   21 tablet   0   . ibuprofen (ADVIL,MOTRIN) 800 MG  tablet   Oral   Take 1 tablet (800 mg total) by mouth 3 (three) times daily.   21 tablet   0    BP 127/93  Pulse 134  Temp(Src) 98.5 F (36.9 C) (Oral)  Resp 22  SpO2 98%  LMP 05/22/2013 Physical Exam  Nursing note and vitals reviewed. Constitutional: She is oriented to person, place, and time. She appears well-developed and well-nourished.  Non-toxic appearance. No distress.  HENT:  Head: Normocephalic and atraumatic.  Eyes: Conjunctivae, EOM and lids are normal. Pupils are equal, round, and reactive to light.  Neck: Normal range of motion. Neck supple. No tracheal deviation present. No mass present.  Cardiovascular: Regular rhythm and normal heart sounds.  Tachycardia present.  Exam reveals no gallop.   No murmur heard. Pulmonary/Chest: Effort normal and breath sounds normal. No stridor. No respiratory distress. She has no decreased breath sounds. She has no wheezes. She has no rhonchi. She has no rales.  Abdominal: Soft. Normal appearance and bowel sounds are normal. She exhibits no distension. There is no tenderness. There is no rebound and no CVA tenderness.  Musculoskeletal: Normal range of motion. She exhibits no edema and no tenderness.  Neurological: She is alert and oriented to person, place, and time. She has  normal strength. No cranial nerve deficit or sensory deficit. GCS eye subscore is 4. GCS verbal subscore is 5. GCS motor subscore is 6.  Skin: Skin is warm and dry. Rash noted. No abrasion noted.  Slight erythema noted to the skin of her back and the hands and feet without definitive hives. Mild papular rash noted  Psychiatric: She has a normal mood and affect. Her speech is normal and behavior is normal.    ED Course  Procedures (including critical care time) Labs Review Labs Reviewed - No data to display Imaging Review No results found.  EKG Interpretation   None       MDM  No diagnosis found. Patient treated for allergic reaction and heart rate has  improved. Her rash is improved at this time as well. She is stable for discharge    Toy Baker, MD 06/17/13 [phone]

## 2013-06-25 ENCOUNTER — Encounter (HOSPITAL_COMMUNITY): Payer: Self-pay | Admitting: Emergency Medicine

## 2013-06-25 ENCOUNTER — Emergency Department (HOSPITAL_COMMUNITY)
Admission: EM | Admit: 2013-06-25 | Discharge: 2013-06-25 | Disposition: A | Discharge status: Home / Self Care | Payer: No Typology Code available for payment source | Attending: Emergency Medicine | Admitting: Emergency Medicine

## 2013-06-25 DIAGNOSIS — Z8719 Personal history of other diseases of the digestive system: Secondary | ICD-10-CM | POA: Insufficient documentation

## 2013-06-25 DIAGNOSIS — Z3202 Encounter for pregnancy test, result negative: Secondary | ICD-10-CM | POA: Insufficient documentation

## 2013-06-25 DIAGNOSIS — N200 Calculus of kidney: Secondary | ICD-10-CM | POA: Insufficient documentation

## 2013-06-25 DIAGNOSIS — Z79899 Other long term (current) drug therapy: Secondary | ICD-10-CM | POA: Insufficient documentation

## 2013-06-25 LAB — URINALYSIS, ROUTINE W REFLEX MICROSCOPIC
Glucose, UA: NEGATIVE mg/dL
Ketones, ur: NEGATIVE mg/dL
Nitrite: NEGATIVE
Specific Gravity, Urine: 1.024 (ref 1.005–1.030)
Urobilinogen, UA: 0.2 mg/dL (ref 0.0–1.0)
pH: 6 (ref 5.0–8.0)

## 2013-06-25 LAB — BASIC METABOLIC PANEL
BUN: 16 mg/dL (ref 6–23)
CO2: 26 mEq/L (ref 19–32)
Chloride: 98 mEq/L (ref 96–112)
Creatinine, Ser: 0.78 mg/dL (ref 0.50–1.10)
GFR calc Af Amer: >90 mL/min (ref >90)
GFR calc non Af Amer: >90 mL/min (ref >90)
Potassium: 4 mEq/L (ref 3.5–5.1)
Sodium: 136 mEq/L (ref 135–145)

## 2013-06-25 LAB — URINE MICROSCOPIC-ADD ON

## 2013-06-25 LAB — POCT PREGNANCY, URINE: Preg Test, Ur: NEGATIVE

## 2013-06-25 MED ORDER — HYDROCODONE-ACETAMINOPHEN 5-325 MG PO TABS
2.0000 | ORAL_TABLET | Freq: Once | ORAL | Status: AC
  Administered 2013-06-25: 2 via ORAL
  Filled 2013-06-25: qty 2

## 2013-06-25 MED ORDER — KETOROLAC TROMETHAMINE 30 MG/ML IJ SOLN
30.0000 mg | Freq: Once | INTRAMUSCULAR | Status: AC
  Administered 2013-06-25: 30 mg via INTRAVENOUS
  Filled 2013-06-25: qty 1

## 2013-06-25 MED ORDER — TAMSULOSIN HCL 0.4 MG PO CAPS: 0.4000 mg | ORAL_CAPSULE | Freq: Every day | ORAL | Status: DC

## 2013-06-25 MED ORDER — OXYCODONE-ACETAMINOPHEN 5-325 MG PO TABS: 1.0000 | ORAL_TABLET | Freq: Four times a day (QID) | ORAL | Status: DC | PRN

## 2013-06-25 NOTE — ED Notes (Signed)
MD at bedside. 

## 2013-06-25 NOTE — ED Notes (Signed)
Pt c/o mid back pain that could be muscle spasm; pt sts x 3 days and meds not helping

## 2013-06-25 NOTE — ED Provider Notes (Signed)
CSN: [id number]     Arrival date & time 06/25/13  1121 History   First MD Initiated Contact with Patient 06/25/13 1135     Chief Complaint  Patient presents with  . Back Pain   (Consider location/radiation/quality/duration/timing/severity/associated sxs/prior Treatment) HPI  This is a 37 year old female who presents with back pain. Patient reports onset of back pain Wednesday. She thought was secondary to her menstrual cycle. She has taken Midol, ibuprofen, Vicodin, Percocet, and Flexeril with little relief of her pain. She states sometimes the pain is worse with movement and sometimes it is worse standing still. She does endorse dysuria without hematuria.  Patient reports that she's had a similar episode in the past. She currently rates her pain a 10 out of 10. She denies any nausea or vomiting, fevers, abdominal pain.  Past Medical History  Diagnosis Date  . Colitis   . No pertinent past medical history    Past Surgical History  Procedure Laterality Date  . Cesarean section    . Tubal ligation     History reviewed. No pertinent family history. History  Substance Use Topics  . Smoking status: Never Smoker   . Smokeless tobacco: Not on file  . Alcohol Use: No   OB History   Grav Para Term Preterm Abortions TAB SAB Ect Mult Living                 Review of Systems  Constitutional: Negative for fever.  Respiratory: Negative for cough, chest tightness and shortness of breath.   Cardiovascular: Negative for chest pain.  Gastrointestinal: Negative for nausea, vomiting and abdominal pain.  Genitourinary: Positive for dysuria. Negative for frequency and hematuria.  Musculoskeletal: Positive for back pain.  Skin: Negative for wound.  Neurological: Negative for headaches.  Psychiatric/Behavioral: Negative for confusion.  All other systems reviewed and are negative.    Allergies  Shellfish-derived products  Home Medications   Current Outpatient Rx  Name  Route  Sig   Dispense  Refill  . cyclobenzaprine (FLEXERIL) 10 MG tablet   Oral   Take 10 mg by mouth 3 (three) times daily as needed for muscle spasms.         . diphenhydrAMINE (BENADRYL) 25 MG tablet   Oral   Take 25 mg by mouth every 6 (six) hours as needed for itching.         . famotidine (PEPCID) 20 MG tablet   Oral   Take 1 tablet (20 mg total) by mouth 2 (two) times daily.   30 tablet   0   . oxyCODONE-acetaminophen (PERCOCET/ROXICET) 5-325 MG per tablet   Oral   Take 1-2 tablets by mouth every 6 (six) hours as needed for severe pain.   15 tablet   0   . tamsulosin (FLOMAX) 0.4 MG CAPS capsule   Oral   Take 1 capsule (0.4 mg total) by mouth daily.   5 capsule   0    BP 115/83  Pulse 89  Temp(Src) 98 F (36.7 C) (Oral)  Resp 18  Ht 5\' 1"  (1.549 m)  Wt 243 lb (110.224 kg)  BMI 45.94 kg/m2  SpO2 100%  LMP 05/22/2013 Physical Exam  Nursing note and vitals reviewed. Constitutional: She is oriented to person, place, and time. She appears well-developed and well-nourished.  No obvious distress  HENT:  Head: Normocephalic and atraumatic.  Cardiovascular: Normal rate, regular rhythm and normal heart sounds.   No murmur heard. Pulmonary/Chest: Effort normal. No respiratory distress.  Abdominal:  Soft. Bowel sounds are normal. There is no tenderness. There is no rebound.  Right-sided CVA tenderness.  No muscle spasm noted.  Musculoskeletal: She exhibits no edema.  Neurological: She is alert and oriented to person, place, and time.  Skin: Skin is warm and dry.  Psychiatric: She has a normal mood and affect.    ED Course  Procedures (including critical care time) Labs Review Labs Reviewed  URINALYSIS, ROUTINE W REFLEX MICROSCOPIC - Abnormal; Notable for the following:    Hgb urine dipstick LARGE (*)    Leukocytes, UA SMALL (*)    All other components within normal limits  URINE MICROSCOPIC-ADD ON - Abnormal; Notable for the following:    Squamous Epithelial / LPF FEW  (*)    All other components within normal limits  BASIC METABOLIC PANEL  POCT PREGNANCY, URINE   Imaging Review No results found.  EKG Interpretation   None       MDM   1. Kidney stone    Patient presents with right-sided flank pain with 3 days duration. She is nontoxic-appearing on exam and vital signs are notable for pulse of 108. Abdominal exam is benign. Review of patient's history shows that she had a kidney stone documented last March. Suspect this may be the etiology versus pyelonephritis versus muscle spasm. BMP, urinalysis, and urine pregnancy were sent. Urinalysis is notable for hemoglobin in the urine.  Patient's pain was controlled with Percocet and Toradol. Discussed with the patient that I felt her symptoms were likely secondary to kidney stone given her known kidney stones in the past.  BMP is reassuring without evidence of renal dysfunction. At this time I do not feel patient needs further imaging and can be discharged home with expectant management. Patient will be given referral to urology.  Patient was given strict return precautions. She will be given Percocet for pain control and Flomax.  After history, exam, and medical workup I feel the patient has been appropriately medically screened and is safe for discharge home. Pertinent diagnoses were discussed with the patient. Patient was given return precautions.   Shon Baton, MD 06/25/13 [phone]

## 2013-06-29 ENCOUNTER — Encounter (HOSPITAL_COMMUNITY): Payer: Self-pay | Admitting: Emergency Medicine

## 2013-06-29 ENCOUNTER — Emergency Department (HOSPITAL_COMMUNITY)
Admission: EM | Admit: 2013-06-29 | Discharge: 2013-06-29 | Disposition: A | Discharge status: Home / Self Care | Payer: No Typology Code available for payment source | Attending: Emergency Medicine | Admitting: Emergency Medicine

## 2013-06-29 DIAGNOSIS — R220 Localized swelling, mass and lump, head: Secondary | ICD-10-CM | POA: Insufficient documentation

## 2013-06-29 DIAGNOSIS — M545 Low back pain, unspecified: Secondary | ICD-10-CM | POA: Insufficient documentation

## 2013-06-29 DIAGNOSIS — K1120 Sialoadenitis, unspecified: Secondary | ICD-10-CM | POA: Insufficient documentation

## 2013-06-29 DIAGNOSIS — IMO0002 Reserved for concepts with insufficient information to code with codable children: Secondary | ICD-10-CM | POA: Insufficient documentation

## 2013-06-29 DIAGNOSIS — J029 Acute pharyngitis, unspecified: Secondary | ICD-10-CM | POA: Insufficient documentation

## 2013-06-29 DIAGNOSIS — Z79899 Other long term (current) drug therapy: Secondary | ICD-10-CM | POA: Insufficient documentation

## 2013-06-29 DIAGNOSIS — Z87442 Personal history of urinary calculi: Secondary | ICD-10-CM | POA: Insufficient documentation

## 2013-06-29 DIAGNOSIS — J3489 Other specified disorders of nose and nasal sinuses: Secondary | ICD-10-CM | POA: Insufficient documentation

## 2013-06-29 HISTORY — DX: Calculus of kidney: N20.0

## 2013-06-29 MED ORDER — DIAZEPAM 5 MG PO TABS
5.0000 mg | ORAL_TABLET | Freq: Once | ORAL | Status: AC
  Administered 2013-06-29: 5 mg via ORAL
  Filled 2013-06-29: qty 1

## 2013-06-29 MED ORDER — IBUPROFEN 600 MG PO TABS: 600.0000 mg | ORAL_TABLET | Freq: Four times a day (QID) | ORAL | Status: DC | PRN

## 2013-06-29 MED ORDER — IBUPROFEN 800 MG PO TABS
800.0000 mg | ORAL_TABLET | Freq: Once | ORAL | Status: AC
  Administered 2013-06-29: 800 mg via ORAL
  Filled 2013-06-29: qty 1

## 2013-06-29 MED ORDER — DIAZEPAM 5 MG PO TABS: 5.0000 mg | ORAL_TABLET | Freq: Two times a day (BID) | ORAL | Status: DC

## 2013-06-29 NOTE — ED Provider Notes (Signed)
CSN: [id number]     Arrival date & time 06/29/13  1804 History   First MD Initiated Contact with Patient 06/29/13 2034     Chief Complaint  Patient presents with  . Sore Throat   (Consider location/radiation/quality/duration/timing/severity/associated sxs/prior Treatment) HPI Pt is a 37yo female with hx of kidney stones presenting to ED with multiple complaints, today most concerning for pt is mild scratchy sore throat associated with left and right sided facial swelling and tenderness just below her earlobes.  Pt reports symptoms started 2 days ago, at that time, swelling was "much" worse.  Pt also states when she eats sour candies it makes pain worse on left side of face.  Reports mild nasal congestion and cough but no ear pain, headache, chest pain or SOB. Denies n/v/d. Does report two of her children were dx with strep throat earlier tonight in the ER.  Pt also c/o persistent right lower back pain for which she was seen last week for same and dx with kidney stone.  Reviewing pt's medical records pt does have hx of kidney stones and did have blood in her urine at that time.  Today pt states pain is not improved, still feels a "knot" pain worse in certain positions, aching and sharp.  Pt has tried husband's flexeril w/o relief.  Also has been taking percocet and vicodin w/o relief. Denies fever chills, n/v/d. Denies IVDU or hx of cancer. Denies urinary or vaginal symptoms. Denies abdominal pain.  Past Medical History  Diagnosis Date  . Colitis   . No pertinent past medical history   . Kidney stone    Past Surgical History  Procedure Laterality Date  . Cesarean section    . Tubal ligation     No family history on file. History  Substance Use Topics  . Smoking status: Never Smoker   . Smokeless tobacco: Not on file  . Alcohol Use: No   OB History   Grav Para Term Preterm Abortions TAB SAB Ect Mult Living                 Review of Systems  Constitutional: Negative for fever, chills  and appetite change.  HENT: Positive for congestion, facial swelling ( left sided "below my left ear"), postnasal drip and sore throat. Negative for dental problem, mouth sores, sinus pressure, sneezing, trouble swallowing and voice change.   Respiratory: Negative for shortness of breath.   Cardiovascular: Negative for chest pain.  Gastrointestinal: Negative for nausea, vomiting and abdominal pain.  Genitourinary: Negative for urgency, hematuria, flank pain and pelvic pain.  Musculoskeletal: Positive for back pain.  All other systems reviewed and are negative.    Allergies  Shellfish-derived products  Home Medications   Current Outpatient Rx  Name  Route  Sig  Dispense  Refill  . calcium elemental as carbonate (TUMS ULTRA 1000) 400 MG tablet   Oral   Chew 1,000 mg by mouth 3 (three) times daily.         . famotidine (PEPCID) 20 MG tablet   Oral   Take 1 tablet (20 mg total) by mouth 2 (two) times daily.   30 tablet   0   . oxyCODONE-acetaminophen (PERCOCET/ROXICET) 5-325 MG per tablet   Oral   Take 1-2 tablets by mouth every 6 (six) hours as needed for severe pain.   15 tablet   0   . oxyCODONE-acetaminophen (PERCOCET/ROXICET) 5-325 MG per tablet   Oral   Take 1-2 tablets by mouth every  4 (four) hours as needed for severe pain.         . predniSONE (DELTASONE) 10 MG tablet   Oral   Take 10 mg by mouth daily with breakfast.         . tamsulosin (FLOMAX) 0.4 MG CAPS capsule   Oral   Take 1 capsule (0.4 mg total) by mouth daily.   5 capsule   0   . diazepam (VALIUM) 5 MG tablet   Oral   Take 1 tablet (5 mg total) by mouth 2 (two) times daily.   10 tablet   0   . ibuprofen (ADVIL,MOTRIN) 600 MG tablet   Oral   Take 1 tablet (600 mg total) by mouth every 6 (six) hours as needed.   30 tablet   0    BP 130/85  Pulse 116  Temp(Src) 98.3 F (36.8 C) (Oral)  Resp 20  Wt 249 lb (112.946 kg)  SpO2 96%  LMP 06/21/2013 Physical Exam  Nursing note and  vitals reviewed. Constitutional: She appears well-developed and well-nourished. No distress.  HENT:  Head: Normocephalic and atraumatic.  Right Ear: Hearing, tympanic membrane, external ear and ear canal normal.  Left Ear: Hearing, tympanic membrane, external ear and ear canal normal.  Nose: Nose normal.  Mouth/Throat: Uvula is midline, oropharynx is clear and moist and mucous membranes are normal. No trismus in the jaw. No dental abscesses or uvula swelling. No oropharyngeal exudate, posterior oropharyngeal edema, posterior oropharyngeal erythema or tonsillar abscesses.  Eyes: Conjunctivae are normal. No scleral icterus.  Neck: Normal range of motion. Neck supple. No JVD present. No tracheal deviation present. No thyromegaly present.    Tenderness over parotid glands, mild edema.  No erythema or warmth.  Cardiovascular: Normal rate, regular rhythm and normal heart sounds.   Pulmonary/Chest: Effort normal and breath sounds normal. No stridor. No respiratory distress. She has no wheezes. She has no rales. She exhibits no tenderness.  Abdominal: Soft. Bowel sounds are normal. She exhibits no distension and no mass. There is no tenderness. There is no rebound and no guarding.  Musculoskeletal: Normal range of motion. She exhibits tenderness ( right sided lumbar musculature).  No midline spinal tenderness. No step offs or crepitus. Right lower lumbar muscular tenderness.  Lymphadenopathy:    She has no cervical adenopathy.  Neurological: She is alert.  Skin: Skin is warm and dry. She is not diaphoretic.    ED Course  Procedures (including critical care time) Labs Review Labs Reviewed  RAPID STREP SCREEN  CULTURE, GROUP A STREP   Imaging Review No results found.  EKG Interpretation   None       MDM   1. Sore throat   2. Parotiditis   3. Low back pain    Pt with multiple complaints presenting with sore throat, facial swelling and tenderness as well as lower back pain. Denies  fever, n/v/d. Denies chest pain or SOB.  Rapid strep: negative. On exam, pt appears well, non-toxic.  Tender over parotid glands bilaterally with mild edema w/o erythema or warmth.  Oropharynx: uvula midline, no tonsillar erythema, edema, or exudates.  No dental abscesses.   Signs and symptoms consistent with parotiditis.   Pt also tender along right lower lumbar musculature. No midline spinal tenderness, step offs or crepitus.  No not having red flag symptoms. Denies urinary or vaginal symptoms.  Pain appears muscular in nature.  All labs/imaging/findings discussed with patient. All questions answered and concerns addressed. Will discharge pt home and  have pt f/u with her PCP and [facility] for continued back pain. Rx: valium and ibuprofen.  Provided pt info packet on parotitis, back pain and heat therapy. Return precautions given. Pt verbalized understanding and agreement with tx plan. Vitals: unremarkable. Discharged in stable condition.          Junius Finner, PA-C 06/29/13 2144

## 2013-06-29 NOTE — ED Notes (Signed)
Pt here with family. Pt states that she has multiple c/o but most prominently she is c/o L sided pain at jaw angle. No V/D.

## 2013-06-29 NOTE — ED Notes (Signed)
Erin, PA at bedside. 

## 2013-07-02 LAB — CULTURE, GROUP A STREP

## 2013-07-02 NOTE — ED Provider Notes (Signed)
Medical screening examination/treatment/procedure(s) were performed by non-physician practitioner and as supervising physician I was immediately available for consultation/collaboration.  Wrigley Plasencia T Regginald Pask, MD 07/02/13 1623 

## 2013-09-27 ENCOUNTER — Encounter (HOSPITAL_COMMUNITY): Payer: Self-pay | Admitting: Emergency Medicine

## 2013-09-27 ENCOUNTER — Emergency Department (HOSPITAL_COMMUNITY)
Admission: EM | Admit: 2013-09-27 | Discharge: 2013-09-27 | Disposition: A | Discharge status: Home / Self Care | Payer: No Typology Code available for payment source | Attending: Emergency Medicine | Admitting: Emergency Medicine

## 2013-09-27 ENCOUNTER — Emergency Department (HOSPITAL_COMMUNITY): Payer: No Typology Code available for payment source

## 2013-09-27 DIAGNOSIS — Z8719 Personal history of other diseases of the digestive system: Secondary | ICD-10-CM | POA: Insufficient documentation

## 2013-09-27 DIAGNOSIS — R197 Diarrhea, unspecified: Secondary | ICD-10-CM | POA: Insufficient documentation

## 2013-09-27 DIAGNOSIS — H53149 Visual discomfort, unspecified: Secondary | ICD-10-CM | POA: Insufficient documentation

## 2013-09-27 DIAGNOSIS — R42 Dizziness and giddiness: Secondary | ICD-10-CM | POA: Insufficient documentation

## 2013-09-27 DIAGNOSIS — R112 Nausea with vomiting, unspecified: Secondary | ICD-10-CM | POA: Insufficient documentation

## 2013-09-27 DIAGNOSIS — Z87442 Personal history of urinary calculi: Secondary | ICD-10-CM | POA: Insufficient documentation

## 2013-09-27 DIAGNOSIS — R279 Unspecified lack of coordination: Secondary | ICD-10-CM | POA: Insufficient documentation

## 2013-09-27 DIAGNOSIS — Z3202 Encounter for pregnancy test, result negative: Secondary | ICD-10-CM | POA: Insufficient documentation

## 2013-09-27 DIAGNOSIS — R51 Headache: Secondary | ICD-10-CM | POA: Insufficient documentation

## 2013-09-27 DIAGNOSIS — R4020 Unspecified coma: Secondary | ICD-10-CM

## 2013-09-27 DIAGNOSIS — R1011 Right upper quadrant pain: Secondary | ICD-10-CM | POA: Insufficient documentation

## 2013-09-27 DIAGNOSIS — R55 Syncope and collapse: Secondary | ICD-10-CM | POA: Insufficient documentation

## 2013-09-27 DIAGNOSIS — R404 Transient alteration of awareness: Secondary | ICD-10-CM | POA: Insufficient documentation

## 2013-09-27 LAB — URINE MICROSCOPIC-ADD ON

## 2013-09-27 LAB — PROTIME-INR
INR: 1.01 (ref 0.00–1.49)
PROTHROMBIN TIME: 13.1 s (ref 11.6–15.2)

## 2013-09-27 LAB — CBC WITH DIFFERENTIAL/PLATELET
BASOS ABS: 0 10*3/uL (ref 0.0–0.1)
BASOS PCT: 0 % (ref 0–1)
EOS ABS: 0.1 10*3/uL (ref 0.0–0.7)
EOS PCT: 1 % (ref 0–5)
HEMATOCRIT: 36.8 % (ref 36.0–46.0)
Hemoglobin: 12.1 g/dL (ref 12.0–15.0)
Lymphocytes Relative: 33 % (ref 12–46)
Lymphs Abs: 2.2 10*3/uL (ref 0.7–4.0)
MCH: 24.9 pg — ABNORMAL LOW (ref 26.0–34.0)
MCHC: 32.9 g/dL (ref 30.0–36.0)
MCV: 75.9 fL — AB (ref 78.0–100.0)
MONO ABS: 0.3 10*3/uL (ref 0.1–1.0)
Monocytes Relative: 5 % (ref 3–12)
Neutro Abs: 3.9 10*3/uL (ref 1.7–7.7)
Neutrophils Relative %: 60 % (ref 43–77)
Platelets: 288 10*3/uL (ref 150–400)
RBC: 4.85 MIL/uL (ref 3.87–5.11)
RDW: 15.6 % — AB (ref 11.5–15.5)
WBC: 6.5 10*3/uL (ref 4.0–10.5)

## 2013-09-27 LAB — URINALYSIS, ROUTINE W REFLEX MICROSCOPIC
Bilirubin Urine: NEGATIVE
GLUCOSE, UA: NEGATIVE mg/dL
HGB URINE DIPSTICK: NEGATIVE
KETONES UR: NEGATIVE mg/dL
Nitrite: NEGATIVE
PROTEIN: NEGATIVE mg/dL
Specific Gravity, Urine: 1.017 (ref 1.005–1.030)
Urobilinogen, UA: 0.2 mg/dL (ref 0.0–1.0)
pH: 6 (ref 5.0–8.0)

## 2013-09-27 LAB — COMPREHENSIVE METABOLIC PANEL
ALBUMIN: 3.2 g/dL — AB (ref 3.5–5.2)
ALT: 11 U/L (ref 0–35)
AST: 12 U/L (ref 0–37)
Alkaline Phosphatase: 101 U/L (ref 39–117)
BUN: 13 mg/dL (ref 6–23)
CALCIUM: 9.3 mg/dL (ref 8.4–10.5)
CO2: 23 mEq/L (ref 19–32)
CREATININE: 0.68 mg/dL (ref 0.50–1.10)
Chloride: 101 mEq/L (ref 96–112)
GFR calc non Af Amer: >90 mL/min (ref >90)
GLUCOSE: 86 mg/dL (ref 70–99)
Potassium: 3.7 mEq/L (ref 3.7–5.3)
Sodium: 137 mEq/L (ref 137–147)
TOTAL PROTEIN: 7.1 g/dL (ref 6.0–8.3)
Total Bilirubin: 0.4 mg/dL (ref 0.3–1.2)

## 2013-09-27 LAB — CBG MONITORING, ED: GLUCOSE-CAPILLARY: 82 mg/dL (ref 70–99)

## 2013-09-27 LAB — POC URINE PREG, ED: Preg Test, Ur: NEGATIVE

## 2013-09-27 LAB — LIPASE, BLOOD: LIPASE: 37 U/L (ref 11–59)

## 2013-09-27 MED ORDER — ACETAMINOPHEN 500 MG PO TABS
1000.0000 mg | ORAL_TABLET | Freq: Once | ORAL | Status: AC
  Administered 2013-09-27: 975 mg via ORAL

## 2013-09-27 MED ORDER — SODIUM CHLORIDE 0.9 % IV BOLUS (SEPSIS): 1000.0000 mL | Freq: Once | INTRAVENOUS | Status: DC

## 2013-09-27 MED ORDER — PROMETHAZINE HCL 25 MG PO TABS: 25.0000 mg | ORAL_TABLET | Freq: Four times a day (QID) | ORAL | Status: DC | PRN

## 2013-09-27 MED ORDER — FENTANYL CITRATE 0.05 MG/ML IJ SOLN
100.0000 ug | Freq: Once | INTRAMUSCULAR | Status: AC
  Administered 2013-09-27: 100 ug via INTRAVENOUS
  Filled 2013-09-27: qty 2

## 2013-09-27 MED ORDER — DIPHENHYDRAMINE HCL 50 MG/ML IJ SOLN
25.0000 mg | Freq: Once | INTRAMUSCULAR | Status: AC
  Administered 2013-09-27: 25 mg via INTRAVENOUS
  Filled 2013-09-27: qty 1

## 2013-09-27 MED ORDER — HYDROCODONE-ACETAMINOPHEN 5-325 MG PO TABS: 2.0000 | ORAL_TABLET | ORAL | Status: DC | PRN

## 2013-09-27 MED ORDER — PROCHLORPERAZINE EDISYLATE 5 MG/ML IJ SOLN
10.0000 mg | Freq: Once | INTRAMUSCULAR | Status: AC
  Administered 2013-09-27: 10 mg via INTRAVENOUS
  Filled 2013-09-27: qty 2

## 2013-09-27 MED ORDER — ACETAMINOPHEN 325 MG PO TABS
ORAL_TABLET | ORAL | Status: AC
  Filled 2013-09-27: qty 2

## 2013-09-27 NOTE — ED Notes (Signed)
CBG 82 

## 2013-09-27 NOTE — ED Provider Notes (Signed)
CSN: [id number]     Arrival date & time 09/27/13  4098 History   First MD Initiated Contact with Patient 09/27/13 [phone]     Chief Complaint  Patient presents with  . Abdominal Pain  . Loss of Consciousness     (Consider location/radiation/quality/duration/timing/severity/associated sxs/prior Treatment) HPI Christine Rowe Is a 38 year old obese black female who presents to the emergency department with chief complaint of abdominal pain, nausea, vomiting and loss of consciousness.  The patient has a past medical history of diagnosis of colitis and January 2014.  The patient was admitted in February of 2014 with pancreatitis and a lipase elevated 439, she also had associated seizures and renal insufficiency and was found to have a kidney stone at that time.  Patient returned in late December of 2014 with another kidney stone.  And was also diagnosed with kidney stones in April and December of 2014.  The patient states that yesterday she ate macaroni and cheese and birthday cake, approximately 2-3 hours later she began having severe right upper quadrant abdominal pain, nausea, for a few spots vomiting, and lightheadedness and weakness.  She states that this is consistent with her previous abdominal pain episodes although she has had intermittent diarrhea with previous episodes.  Her husband also gives a history and states that this morning she stood up quickly and hit her head on a heavy low hanging layout.  He states that she became off balance at the time of the event.  She did not lose consciousness at that time.  She is able to perform her ADLs but did have another episode of dizziness and presyncope in the bathroom later.  On the way to work the patient lost consciousness in the car while her husband was driving for approximately 30 seconds and he came here to the ED for further evaluation.  Patient continues to have vomiting and right upper quadrant abdominal pain.  Patient denies possibility of  pregnancy.  She has no urinary symptoms, Flank pain.  Patient does have her gallbladder.  She denies any contacts with similar symptoms.  No recent foreign travel or ingestion of suspicious foods.  The patient does complain of headache and photophobia.  She denies visual changes but does have ataxia and dizziness.  The patient takes no anticoagulants.  She hit her head on the right frontal area.  She denies a history of migraines. Review of patient's chart from her admission in February of 2014 shows that she was seen by Dr. Dulce Sellar of gastroenterology.  The patient had abdominal ultrasound and CT scan scan which did not show any acute abnormalities.  Other than her kidney stones.  She is either a hemangioma of the liver. No  HIDA scan appears to have been performed at that time.  Past Medical History  Diagnosis Date  . Colitis   . No pertinent past medical history   . Kidney stone    Past Surgical History  Procedure Laterality Date  . Cesarean section    . Tubal ligation     No family history on file. History  Substance Use Topics  . Smoking status: Never Smoker   . Smokeless tobacco: Not on file  . Alcohol Use: No   OB History   Grav Para Term Preterm Abortions TAB SAB Ect Mult Living                 Review of Systems  Ten systems reviewed and are negative for acute change, except as noted in  the HPI.    Allergies  Shellfish-derived products  Home Medications   Current Outpatient Rx  Name  Route  Sig  Dispense  Refill  . HYDROcodone-acetaminophen (NORCO/VICODIN) 5-325 MG per tablet   Oral   Take 1 tablet by mouth every 6 (six) hours as needed.           BP 105/72  Pulse 96  Temp(Src) 98.6 F (37 C) (Oral)  SpO2 100% Physical Exam Physical Exam  Nursing note and vitals reviewed. Constitutional: She is oriented to person, place, and time. She appears well-developed and well-nourished.  He appears very uncomfortable.  She is sitting on the edge of the bed and  tripod position with her eyes closed.   HENT:  Head: Normocephalic and atraumatic.  Eyes: Conjunctivae normal and EOM are normal. Pupils are equal, round, and reactive to light. No scleral icterus.  Neck: Normal range of motion.  Cardiovascular: Normal rate, regular rhythm and normal heart sounds.  Exam reveals no gallop and no friction rub.   No murmur heard. Pulmonary/Chest: Effort normal and breath sounds normal. No respiratory distress.  Abdominal: Soft. Bowel sounds are normal. She exhibits no distension and no mass. There is no tenderness. There is no guarding.  Neurological: She is alert and oriented to person, place, and time.  Speech is clear and goal oriented, follows commands Major Cranial nerves without deficit, no facial droop Normal strength in upper and lower extremities bilaterally including dorsiflexion and plantar flexion, strong and equal grip strength Sensation normal to light and sharp touch Moves extremities without ataxia, coordination intact Normal finger to nose and rapid alternating movements Neg romberg, no pronator drift Normal gait Normal heel-shin and balance Skin: Skin is warm and dry. She is not diaphoretic.    ED Course  Procedures (including critical care time) Labs Review Labs Reviewed  CBC WITH DIFFERENTIAL - Abnormal; Notable for the following:    MCV 75.9 (*)    MCH 24.9 (*)    RDW 15.6 (*)    All other components within normal limits  COMPREHENSIVE METABOLIC PANEL - Abnormal; Notable for the following:    Albumin 3.2 (*)    All other components within normal limits  URINALYSIS, ROUTINE W REFLEX MICROSCOPIC - Abnormal; Notable for the following:    APPearance CLOUDY (*)    Leukocytes, UA LARGE (*)    All other components within normal limits  URINE MICROSCOPIC-ADD ON - Abnormal; Notable for the following:    Squamous Epithelial / LPF MANY (*)    Bacteria, UA FEW (*)    All other components within normal limits  URINE CULTURE   PROTIME-INR  LIPASE, BLOOD  CBG MONITORING, ED  POC URINE PREG, ED   Imaging Review No results found.   EKG Interpretation   Date/Time:  Tuesday September 27 2013 09:18:35 EDT Ventricular Rate:  88 PR Interval:  164 QRS Duration: 87 QT Interval:  353 QTC Calculation: 427 R Axis:   53 Text Interpretation:  Sinus rhythm Confirmed by HORTON  MD, COURTNEY  ([id number]) on 09/27/2013 9:24:28 AM      MDM   Final diagnoses:  RUQ abdominal pain  Nausea and vomiting in adult  LOC (loss of consciousness)    9:05 AM. BP 105/72  Pulse 96  Temp(Src) 98.6 F (37 C) (Oral)  SpO2 100% Concern for cholecystitis.   Continued pain, no vomiting. Her labs appear normal. Will order abdominal US.   [place] negative, pain and nausea improved. No neurologic deficits. Will  refer to [facility] for HIDA scan.       Arthor Captain, PA-C 09/28/13 [phone]

## 2013-09-27 NOTE — ED Notes (Signed)
Onset one day ago general abdominal pain with nausea and emesis. Last bowel movement one day ago normal per patient.  Continued today stood up from a couch hit head denied LOC however short time later Husband states patient syncope event 20-30 seconds.  Patient alert answering and following commands appropriate.

## 2013-09-27 NOTE — Discharge Instructions (Signed)
Please set up an appointment with [facility] for HIDA scan. Your labs, CT scan , and US of the  Abdomen were negative.  HEAD INJURY If any of the following occur notify your physician or go to the Hospital Emergency Department:  Increased drowsiness, stupor or loss of consciousness  Restlessness or convulsions (fits)  Paralysis in arms or legs  Temperature above 100 F  Vomiting  Severe headache  Blood or clear fluid dripping from the nose or ears  Stiffness of the neck  Dizziness or blurred vision  Pulsating pain in the eye  Unequal pupils of eye  Personality changes  Any other unusual symptoms PRECAUTIONS  Keep head elevated at all times for the first 24 hours (Elevate mattress if pillow is ineffective)  Avoid aspirin. Use only acetaminophen (e.g. Tylenol) or ibuprofen (e.g. Advil) for relief of pain. Follow directions on the bottle for dosage.  Use ice packs for comfort  MEDICATIONS Use medications only as directed by your physician    Abdominal (belly) pain can be caused by many things. Your caregiver performed an examination and possibly ordered blood/urine tests and imaging (CT scan, x-rays, ultrasound). Many cases can be observed and treated at home after initial evaluation in the emergency department. Even though you are being discharged home, abdominal pain can be unpredictable. Therefore, you need a repeated exam if your pain does not resolve, returns, or worsens. Most patients with abdominal pain don't have to be admitted to the hospital or have surgery, but serious problems like appendicitis and gallbladder attacks can start out as nonspecific pain. Many abdominal conditions cannot be diagnosed in one visit, so follow-up evaluations are very important. SEEK IMMEDIATE MEDICAL ATTENTION IF: The pain does not go away or becomes severe.  A temperature above 101 develops.  Repeated vomiting occurs (multiple episodes).  The pain becomes  localized to portions of the abdomen. The right side could possibly be appendicitis. In an adult, the left lower portion of the abdomen could be colitis or diverticulitis.  Blood is being passed in stools or vomit (bright red or black tarry stools).  Return also if you develop chest pain, difficulty breathing, dizziness or fainting, or become confused, poorly responsive, or inconsolable (young children).   HIDA (Hepatobiliary) Scan Your caregiver has suggested that you have a HIDA Scan. This is also known as a hepatobiliary scan. The HIDA Scan helps evaluate the hepatobiliary system (liver and gallbladder and their ducts). Your liver is the organ in your body that produces bile. The bile is then collected in the gallbladder. The bile is stored and concentrated in the gallbladder. The bile is excreted (passed) into the small intestine when it is needed for digestion. A stone can block the duct (tube) leading from the gallbladder to the small intestine. This can cause an inflammation of the gallbladder (cholecystitis). Because bile is always needed for fat processing, you may feel a gallbladder attack especially after eating a fatty meal. LET YOUR CAREGIVER KNOW ABOUT:  Allergies.  Medications taken including herbs, eye drops, over the counter medications, and creams.  Use of steroids (by mouth or creams).  Previous problems with anesthetics or novocaine.  Possibility of pregnancy, if this applies.  History of blood clots (thrombophlebitis).  History of bleeding or blood problems.  Previous surgery.  Other health problems. BEFORE THE PROCEDURE  Do not eat or drink anything after midnight the night before the exam as instructed.  You may take medications with a small amount of water the  morning of the exam unless your caregiver instructs you otherwise. You should be present 60 minutes prior to your procedure or as directed.  PROCEDURE   An IV will be placed in your arm and remain  throughout the exam.  A small amount of very short acting radioactive material will be injected into the IV.  While lying down a special camera will be placed over your abdomen (belly). This camera is used to detect the injected material. The camera will place images on film. A radiologist (specialist in reading x-rays) can evaluate the images. It will help determine how well your gallbladder is working.  You will then be given a material called CCK. This will make your gallbladder contract. It occasionally causes symptoms (problems) that mimic a gallbladder attack or the feeling you have after eating a fatty meal.  The entire test usually takes one to two hours. Your caregiver can give you more accurate times. Following the test you may go home and resume normal activities and diet as instructed. Ask your caregiver how you are to find out your results. Remember, it is your responsibility to find out the results of your test. Do not assume everything is all right or "normal" if you have not heard from your caregiver. Document Released: 06/27/2000 Document Revised: 09/22/2011 Document Reviewed: 06/30/2005 [facility] [employer] Patient Information 2014 [place], [place].   Biliary Colic  Biliary colic is a steady or irregular pain in the upper abdomen. It is usually under the right side of the rib cage. It happens when gallstones interfere with the normal flow of bile from the gallbladder. Bile is a liquid that helps to digest fats. Bile is made in the liver and stored in the gallbladder. When you eat a meal, bile passes from the gallbladder through the cystic duct and the common bile duct into the small intestine. There, it mixes with partially digested food. If a gallstone blocks either of these ducts, the normal flow of bile is blocked. The muscle cells in the bile duct contract forcefully to try to move the stone. This causes the pain of biliary colic.  SYMPTOMS   A person with biliary colic usually complains  of pain in the upper abdomen. This pain can be:  In the center of the upper abdomen just below the breastbone.  In the upper-right part of the abdomen, near the gallbladder and liver.  Spread back toward the right shoulder blade.  Nausea and vomiting.  The pain usually occurs after eating.  Biliary colic is usually triggered by the digestive system's demand for bile. The demand for bile is high after fatty meals. Symptoms can also occur when a person who has been fasting suddenly eats a very large meal. Most episodes of biliary colic pass after 1 to 5 hours. After the most intense pain passes, your abdomen may continue to ache mildly for about 24 hours. DIAGNOSIS  After you describe your symptoms, your caregiver will perform a physical exam. He or she will pay attention to the upper right portion of your belly (abdomen). This is the area of your liver and gallbladder. An ultrasound will help your caregiver look for gallstones. Specialized scans of the gallbladder may also be done. Blood tests may be done, especially if you have fever or if your pain persists. PREVENTION  Biliary colic can be prevented by controlling the risk factors for gallstones. Some of these risk factors, such as heredity, increasing age, and pregnancy are a normal part of life. Obesity and a  high-fat diet are risk factors you can change through a healthy lifestyle. Women going through menopause who take hormone replacement therapy (estrogen) are also more likely to develop biliary colic. TREATMENT   Pain medication may be prescribed.  You may be encouraged to eat a fat-free diet.  If the first episode of biliary colic is severe, or episodes of colic keep retuning, surgery to remove the gallbladder (cholecystectomy) is usually recommended. This procedure can be done through small incisions using an instrument called a laparoscope. The procedure often requires a brief stay in the hospital. Some people can leave the hospital  the same day. It is the most widely used treatment in people troubled by painful gallstones. It is effective and safe, with no complications in more than 90% of cases.  If surgery cannot be done, medication that dissolves gallstones may be used. This medication is expensive and can take months or years to work. Only small stones will dissolve.  Rarely, medication to dissolve gallstones is combined with a procedure called shock-wave lithotripsy. This procedure uses carefully aimed shock waves to break up gallstones. In many people treated with this procedure, gallstones form again within a few years. PROGNOSIS  If gallstones block your cystic duct or common bile duct, you are at risk for repeated episodes of biliary colic. There is also a 25% chance that you will develop a gallbladder infection(acute cholecystitis), or some other complication of gallstones within 10 to 20 years. If you have surgery, schedule it at a time that is convenient for you and at a time when you are not sick. HOME CARE INSTRUCTIONS   Drink plenty of clear fluids.  Avoid fatty, greasy or fried foods, or any foods that make your pain worse.  Take medications as directed. SEEK MEDICAL CARE IF:   You develop a fever over 100.5 F (38.1 C).  Your pain gets worse over time.  You develop nausea that prevents you from eating and drinking.  You develop vomiting. SEEK IMMEDIATE MEDICAL CARE IF:   You have continuous or severe belly (abdominal) pain which is not relieved with medications.  You develop nausea and vomiting which is not relieved with medications.  You have symptoms of biliary colic and you suddenly develop a fever and shaking chills. This may signal cholecystitis. Call your caregiver immediately.  You develop a yellow color to your skin or the white part of your eyes (jaundice). Document Released: 12/01/2005 Document Revised: 09/22/2011 Document Reviewed: 02/10/2008 [facility] Patient Information 2014  [place], [place].  Syncope Syncope is a fainting spell. This means the person loses consciousness and drops to the ground. The person is generally unconscious for less than 5 minutes. The person may have some muscle twitches for up to 15 seconds before waking up and returning to normal. Syncope occurs more often in elderly people, but it can happen to anyone. While most causes of syncope are not dangerous, syncope can be a sign of a serious medical problem. It is important to seek medical care.  CAUSES  Syncope is caused by a sudden decrease in blood flow to the brain. The specific cause is often not determined. Factors that can trigger syncope include:  Taking medicines that lower blood pressure.  Sudden changes in posture, such as standing up suddenly.  Taking more medicine than prescribed.  Standing in one place for too long.  Seizure disorders.  Dehydration and excessive exposure to heat.  Low blood sugar (hypoglycemia).  Straining to have a bowel movement.  Heart disease,  irregular heartbeat, or other circulatory problems.  Fear, emotional distress, seeing blood, or severe pain. SYMPTOMS  Right before fainting, you may:  Feel dizzy or lightheaded.  Feel nauseous.  See all white or all black in your field of vision.  Have cold, clammy skin. DIAGNOSIS  Your caregiver will ask about your symptoms, perform a physical exam, and perform electrocardiography (ECG) to record the electrical activity of your heart. Your caregiver may also perform other heart or blood tests to determine the cause of your syncope. TREATMENT  In most cases, no treatment is needed. Depending on the cause of your syncope, your caregiver may recommend changing or stopping some of your medicines. HOME CARE INSTRUCTIONS  Have someone stay with you until you feel stable.  Do not drive, operate machinery, or play sports until your caregiver says it is okay.  Keep all follow-up appointments as directed by  your caregiver.  Lie down right away if you start feeling like you might faint. Breathe deeply and steadily. Wait until all the symptoms have passed.  Drink enough fluids to keep your urine clear or pale yellow.  If you are taking blood pressure or heart medicine, get up slowly, taking several minutes to sit and then stand. This can reduce dizziness. SEEK IMMEDIATE MEDICAL CARE IF:   You have a severe headache.  You have unusual pain in the chest, abdomen, or back.  You are bleeding from the mouth or rectum, or you have black or tarry stool.  You have an irregular or very fast heartbeat.  You have pain with breathing.  You have repeated fainting or seizure-like jerking during an episode.  You faint when sitting or lying down.  You have confusion.  You have difficulty walking.  You have severe weakness.  You have vision problems. If you fainted, call your local emergency services (911 in [place] Do not drive yourself to the hospital.  MAKE SURE YOU:  Understand these instructions.  Will watch your condition.  Will get help right away if you are not doing well or get worse. Document Released: 06/30/2005 Document Revised: 12/30/2011 Document Reviewed: 08/29/2011 [facility] Patient Information 2014 [place], [place].

## 2013-09-28 LAB — URINE CULTURE
Colony Count: 5000
Special Requests: NORMAL

## 2013-09-29 NOTE — ED Provider Notes (Signed)
Medical screening examination/treatment/procedure(s) were performed by non-physician practitioner and as supervising physician I was immediately available for consultation/collaboration.   EKG Interpretation   Date/Time:  Tuesday September 27 2013 09:18:35 EDT Ventricular Rate:  88 PR Interval:  164 QRS Duration: 87 QT Interval:  353 QTC Calculation: 427 R Axis:   53 Text Interpretation:  Sinus rhythm Confirmed by Wilkie AyeHORTON  MD, COURTNEY  ([id number]) on 09/27/2013 9:24:28 AM       Shon Batonourtney F Horton, MD 09/29/13 1512

## 2013-11-23 ENCOUNTER — Other Ambulatory Visit: Payer: Self-pay | Admitting: Gastroenterology

## 2013-11-23 DIAGNOSIS — R109 Unspecified abdominal pain: Secondary | ICD-10-CM

## 2013-11-25 ENCOUNTER — Ambulatory Visit
Admission: RE | Admit: 2013-11-25 | Discharge: 2013-11-25 | Disposition: A | Discharge status: Home / Self Care | Payer: No Typology Code available for payment source | Source: Ambulatory Visit | Attending: Gastroenterology | Admitting: Gastroenterology

## 2013-11-25 DIAGNOSIS — R109 Unspecified abdominal pain: Secondary | ICD-10-CM

## 2013-11-30 ENCOUNTER — Other Ambulatory Visit (HOSPITAL_COMMUNITY): Payer: Self-pay | Admitting: Gastroenterology

## 2013-11-30 DIAGNOSIS — R109 Unspecified abdominal pain: Secondary | ICD-10-CM

## 2013-12-09 ENCOUNTER — Encounter (HOSPITAL_COMMUNITY)
Admission: RE | Admit: 2013-12-09 | Discharge: 2013-12-09 | Disposition: A | Discharge status: Home / Self Care | Payer: No Typology Code available for payment source | Source: Ambulatory Visit | Attending: Gastroenterology | Admitting: Gastroenterology

## 2013-12-09 DIAGNOSIS — R109 Unspecified abdominal pain: Secondary | ICD-10-CM | POA: Diagnosis present

## 2013-12-09 MED ORDER — TECHNETIUM TC 99M MEBROFENIN IV KIT
5.0000 | PACK | Freq: Once | INTRAVENOUS | Status: AC | PRN
  Administered 2013-12-09: 5 via INTRAVENOUS

## 2013-12-09 MED ORDER — SINCALIDE 5 MCG IJ SOLR
INTRAMUSCULAR | Status: AC
  Administered 2013-12-09: 2.3 ug via INTRAVENOUS
  Filled 2013-12-09: qty 10

## 2013-12-09 MED ORDER — SINCALIDE 5 MCG IJ SOLR
0.0200 ug/kg | Freq: Once | INTRAMUSCULAR | Status: AC
  Administered 2013-12-09: 2.3 ug via INTRAVENOUS

## 2013-12-09 MED ORDER — STERILE WATER FOR INJECTION IJ SOLN
INTRAMUSCULAR | Status: AC
  Filled 2013-12-09: qty 10

## 2013-12-15 ENCOUNTER — Emergency Department (HOSPITAL_COMMUNITY)
Admission: EM | Admit: 2013-12-15 | Discharge: 2013-12-15 | Disposition: A | Discharge status: Home / Self Care | Payer: No Typology Code available for payment source | Attending: Emergency Medicine | Admitting: Emergency Medicine

## 2013-12-15 ENCOUNTER — Encounter (HOSPITAL_COMMUNITY): Payer: Self-pay | Admitting: Emergency Medicine

## 2013-12-15 DIAGNOSIS — N390 Urinary tract infection, site not specified: Secondary | ICD-10-CM | POA: Diagnosis not present

## 2013-12-15 DIAGNOSIS — R109 Unspecified abdominal pain: Secondary | ICD-10-CM

## 2013-12-15 DIAGNOSIS — Z87442 Personal history of urinary calculi: Secondary | ICD-10-CM | POA: Diagnosis not present

## 2013-12-15 DIAGNOSIS — Z91013 Allergy to seafood: Secondary | ICD-10-CM | POA: Diagnosis not present

## 2013-12-15 DIAGNOSIS — R197 Diarrhea, unspecified: Secondary | ICD-10-CM | POA: Insufficient documentation

## 2013-12-15 DIAGNOSIS — Z8719 Personal history of other diseases of the digestive system: Secondary | ICD-10-CM | POA: Insufficient documentation

## 2013-12-15 DIAGNOSIS — R0602 Shortness of breath: Secondary | ICD-10-CM | POA: Diagnosis not present

## 2013-12-15 LAB — COMPREHENSIVE METABOLIC PANEL
ALBUMIN: 3.2 g/dL — AB (ref 3.5–5.2)
ALK PHOS: 131 U/L — AB (ref 39–117)
ALT: 18 U/L (ref 0–35)
AST: 17 U/L (ref 0–37)
BILIRUBIN TOTAL: 0.3 mg/dL (ref 0.3–1.2)
BUN: 11 mg/dL (ref 6–23)
CHLORIDE: 101 meq/L (ref 96–112)
CO2: 21 meq/L (ref 19–32)
Calcium: 9.5 mg/dL (ref 8.4–10.5)
Creatinine, Ser: 0.61 mg/dL (ref 0.50–1.10)
GFR calc Af Amer: >90 mL/min (ref >90)
GFR calc non Af Amer: >90 mL/min (ref >90)
GLUCOSE: 105 mg/dL — AB (ref 70–99)
Potassium: 3.9 mEq/L (ref 3.7–5.3)
Sodium: 139 mEq/L (ref 137–147)
Total Protein: 7.1 g/dL (ref 6.0–8.3)

## 2013-12-15 LAB — URINE MICROSCOPIC-ADD ON

## 2013-12-15 LAB — CBC WITH DIFFERENTIAL/PLATELET
BASOS PCT: 0 % (ref 0–1)
Basophils Absolute: 0 10*3/uL (ref 0.0–0.1)
Eosinophils Absolute: 0.8 10*3/uL — ABNORMAL HIGH (ref 0.0–0.7)
Eosinophils Relative: 11 % — ABNORMAL HIGH (ref 0–5)
HEMATOCRIT: 37.7 % (ref 36.0–46.0)
HEMOGLOBIN: 12.9 g/dL (ref 12.0–15.0)
LYMPHS ABS: 3 10*3/uL (ref 0.7–4.0)
Lymphocytes Relative: 39 % (ref 12–46)
MCH: 25.2 pg — ABNORMAL LOW (ref 26.0–34.0)
MCHC: 34.2 g/dL (ref 30.0–36.0)
MCV: 73.8 fL — ABNORMAL LOW (ref 78.0–100.0)
MONO ABS: 0.4 10*3/uL (ref 0.1–1.0)
MONOS PCT: 5 % (ref 3–12)
NEUTROS ABS: 3.5 10*3/uL (ref 1.7–7.7)
Neutrophils Relative %: 45 % (ref 43–77)
Platelets: 316 10*3/uL (ref 150–400)
RBC: 5.11 MIL/uL (ref 3.87–5.11)
RDW: 16 % — ABNORMAL HIGH (ref 11.5–15.5)
WBC: 7.7 10*3/uL (ref 4.0–10.5)

## 2013-12-15 LAB — URINALYSIS, ROUTINE W REFLEX MICROSCOPIC
Bilirubin Urine: NEGATIVE
GLUCOSE, UA: NEGATIVE mg/dL
KETONES UR: NEGATIVE mg/dL
Nitrite: NEGATIVE
PROTEIN: NEGATIVE mg/dL
Specific Gravity, Urine: 1.026 (ref 1.005–1.030)
UROBILINOGEN UA: 1 mg/dL (ref 0.0–1.0)
pH: 6 (ref 5.0–8.0)

## 2013-12-15 LAB — POC URINE PREG, ED: Preg Test, Ur: NEGATIVE

## 2013-12-15 LAB — LIPASE, BLOOD: Lipase: 44 U/L (ref 11–59)

## 2013-12-15 MED ORDER — ONDANSETRON 4 MG PO TBDP: 4.0000 mg | ORAL_TABLET | Freq: Three times a day (TID) | ORAL | Status: DC | PRN

## 2013-12-15 MED ORDER — OXYCODONE-ACETAMINOPHEN 5-325 MG PO TABS
2.0000 | ORAL_TABLET | Freq: Once | ORAL | Status: AC
  Administered 2013-12-15: 2 via ORAL
  Filled 2013-12-15: qty 2

## 2013-12-15 MED ORDER — OXYCODONE-ACETAMINOPHEN 5-325 MG PO TABS: 1.0000 | ORAL_TABLET | ORAL | Status: DC | PRN

## 2013-12-15 MED ORDER — METRONIDAZOLE 500 MG PO TABS: 500.0000 mg | ORAL_TABLET | Freq: Two times a day (BID) | ORAL | Status: DC

## 2013-12-15 MED ORDER — ONDANSETRON 4 MG PO TBDP
8.0000 mg | ORAL_TABLET | Freq: Once | ORAL | Status: AC
  Administered 2013-12-15: 8 mg via ORAL
  Filled 2013-12-15: qty 2

## 2013-12-15 MED ORDER — CIPROFLOXACIN HCL 500 MG PO TABS: 500.0000 mg | ORAL_TABLET | Freq: Two times a day (BID) | ORAL | Status: DC

## 2013-12-15 MED ORDER — NAPROXEN 500 MG PO TABS: 500.0000 mg | ORAL_TABLET | Freq: Two times a day (BID) | ORAL | Status: DC

## 2013-12-15 MED ORDER — GI COCKTAIL ~~LOC~~
30.0000 mL | Freq: Once | ORAL | Status: AC
  Administered 2013-12-15: 30 mL via ORAL
  Filled 2013-12-15: qty 30

## 2013-12-15 NOTE — ED Provider Notes (Signed)
CSN: [id number]     Arrival date & time 12/15/13  0144 History   First MD Initiated Contact with Patient 12/15/13 0435     Chief Complaint  Patient presents with  . Shortness of Breath  . Abdominal Pain  . Diarrhea     (Consider location/radiation/quality/duration/timing/severity/associated sxs/prior Treatment) HPI Comments: 38 year old female, history of a prior C-section and a tubal ligation who also has a history of colitis and kidney stones. She presents with a complaint of pain in the epigastrium radiating to the left upper quadrant. This started approximately one hour prior to arrival, is described as cramping and severe. Nothing seems to make it better or worse. She denies any vomiting but does have some nausea and had one episode of diarrhea which she describes as nonbloody and loose. She did have a small of palpitations prior to arrival. Currently her discomfort is rated 7/10, gradually improving, denies alcohol or prior surgical history.  LMP 5/25, normal and regular  Patient is a 38 y.o. female presenting with shortness of breath, abdominal pain, and diarrhea. The history is provided by the patient.  Shortness of Breath Associated symptoms: abdominal pain   Abdominal Pain Associated symptoms: diarrhea and shortness of breath   Diarrhea Associated symptoms: abdominal pain     Past Medical History  Diagnosis Date  . Colitis   . No pertinent past medical history   . Kidney stone    Past Surgical History  Procedure Laterality Date  . Cesarean section    . Tubal ligation     No family history on file. History  Substance Use Topics  . Smoking status: Never Smoker   . Smokeless tobacco: Not on file  . Alcohol Use: No   OB History   Grav Para Term Preterm Abortions TAB SAB Ect Mult Living                 Review of Systems  Respiratory: Positive for shortness of breath.   Gastrointestinal: Positive for abdominal pain and diarrhea.  All other systems reviewed and  are negative.     Allergies  Shellfish-derived products  Home Medications   Prior to Admission medications   Medication Sig Start Date End Date Taking? Authorizing Provider  cyclobenzaprine (FLEXERIL) 10 MG tablet Take 10 mg by mouth 3 (three) times daily as needed for muscle spasms.   Yes Historical Provider, MD  ibuprofen (ADVIL,MOTRIN) 200 MG tablet Take 800 mg by mouth every 6 (six) hours as needed for moderate pain.   Yes Historical Provider, MD  ciprofloxacin (CIPRO) 500 MG tablet Take 1 tablet (500 mg total) by mouth every 12 (twelve) hours. 12/15/13   Vida Roller, MD  metroNIDAZOLE (FLAGYL) 500 MG tablet Take 1 tablet (500 mg total) by mouth 2 (two) times daily. 12/15/13   Vida Roller, MD  naproxen (NAPROSYN) 500 MG tablet Take 1 tablet (500 mg total) by mouth 2 (two) times daily with a meal. 12/15/13   Vida Roller, MD  ondansetron (ZOFRAN ODT) 4 MG disintegrating tablet Take 1 tablet (4 mg total) by mouth every 8 (eight) hours as needed for nausea. 12/15/13   Vida Roller, MD  oxyCODONE-acetaminophen (PERCOCET) 5-325 MG per tablet Take 1 tablet by mouth every 4 (four) hours as needed. 12/15/13   Vida Roller, MD   BP 122/82  Pulse 103  Temp(Src) 98.1 F (36.7 C) (Oral)  Resp 20  SpO2 100%  LMP 12/03/2013 Physical Exam  Nursing note and vitals reviewed.  Constitutional: She appears well-developed and well-nourished. No distress.  HENT:  Head: Normocephalic and atraumatic.  Mouth/Throat: Oropharynx is clear and moist. No oropharyngeal exudate.  Eyes: Conjunctivae and EOM are normal. Pupils are equal, round, and reactive to light. Right eye exhibits no discharge. Left eye exhibits no discharge. No scleral icterus.  Neck: Normal range of motion. Neck supple. No JVD present. No thyromegaly present.  Cardiovascular: Normal rate, regular rhythm, normal heart sounds and intact distal pulses.  Exam reveals no gallop and no friction rub.   No murmur heard. Pulmonary/Chest:  Effort normal and breath sounds normal. No respiratory distress. She has no wheezes. She has no rales.  Abdominal: Soft. Bowel sounds are normal. She exhibits no distension and no mass. There is tenderness (tenderness to palpation across the epigastrium, left upper quadrant and left abdomen including left lower quadrant. No pain at McBurney's point.).  Musculoskeletal: Normal range of motion. She exhibits no edema and no tenderness.  Lymphadenopathy:    She has no cervical adenopathy.  Neurological: She is alert. Coordination normal.  Skin: Skin is warm and dry. No rash noted. No erythema.  Psychiatric: She has a normal mood and affect. Her behavior is normal.    ED Course  Procedures (including critical care time) Labs Review Labs Reviewed  CBC WITH DIFFERENTIAL - Abnormal; Notable for the following:    MCV 73.8 (*)    MCH 25.2 (*)    RDW 16.0 (*)    Eosinophils Relative 11 (*)    Eosinophils Absolute 0.8 (*)    All other components within normal limits  COMPREHENSIVE METABOLIC PANEL - Abnormal; Notable for the following:    Glucose, Bld 105 (*)    Albumin 3.2 (*)    Alkaline Phosphatase 131 (*)    All other components within normal limits  URINALYSIS, ROUTINE W REFLEX MICROSCOPIC - Abnormal; Notable for the following:    APPearance CLOUDY (*)    Hgb urine dipstick TRACE (*)    Leukocytes, UA LARGE (*)    All other components within normal limits  URINE MICROSCOPIC-ADD ON - Abnormal; Notable for the following:    Squamous Epithelial / LPF MANY (*)    Bacteria, UA FEW (*)    All other components within normal limits  LIPASE, BLOOD  POC URINE PREG, ED    Imaging Review No results found.   EKG Interpretation   Date/Time:  Thursday December 15 2013 01:45:54 EDT Ventricular Rate:  109 PR Interval:  166 QRS Duration: 70 QT Interval:  322 QTC Calculation: 433 R Axis:   42 Text Interpretation:  Sinus tachycardia Otherwise normal ECG Since last  tracing rate faster Confirmed  by Alonnah Lampkins  MD, Breslin Burklow ([id number]) on 12/15/2013  4:37:24 AM      MDM   Final diagnoses:  Abdominal pain  UTI (lower urinary tract infection)    Heart rate is 100, urinalysis shows large leukocytes, 21-50 white blood cells. No leukocytosis, no anemia, normal electrolytes, renal function and liver function.  Lipase is normal as well. The patient likely has gastrointestinal symptoms, possibly coming from colitis, would also consider urinary tract infection. EKG unremarkable overall. Medications given.  Improved after meds as below - has normal VS at this time, labs reassuring, meds to be given for home.  She has a non surgical abdomen.  Pt is aware of all findings and is in agreement with plan  Meds given in ED:  Medications  ondansetron (ZOFRAN-ODT) disintegrating tablet 8 mg (8 mg Oral Given 12/15/13  0206)  oxyCODONE-acetaminophen (PERCOCET/ROXICET) 5-325 MG per tablet 2 tablet (2 tablets Oral Given 12/15/13 0517)  gi cocktail (Maalox,Lidocaine,Donnatal) (30 mLs Oral Given 12/15/13 0445)    New Prescriptions   CIPROFLOXACIN (CIPRO) 500 MG TABLET    Take 1 tablet (500 mg total) by mouth every 12 (twelve) hours.   METRONIDAZOLE (FLAGYL) 500 MG TABLET    Take 1 tablet (500 mg total) by mouth 2 (two) times daily.   NAPROXEN (NAPROSYN) 500 MG TABLET    Take 1 tablet (500 mg total) by mouth 2 (two) times daily with a meal.   ONDANSETRON (ZOFRAN ODT) 4 MG DISINTEGRATING TABLET    Take 1 tablet (4 mg total) by mouth every 8 (eight) hours as needed for nausea.   OXYCODONE-ACETAMINOPHEN (PERCOCET) 5-325 MG PER TABLET    Take 1 tablet by mouth every 4 (four) hours as needed.        Vida RollerBrian D Cimone Fahey, MD 12/15/13 [phone]

## 2013-12-15 NOTE — Discharge Instructions (Signed)
Please call your doctor for a followup appointment within 24-48 hours. When you talk to your doctor please let them know that you were seen in the emergency department and have them acquire all of your records so that they can discuss the findings with you and formulate a treatment plan to fully care for your new and ongoing problems. ° °

## 2013-12-15 NOTE — ED Notes (Addendum)
C/o abd pain, sudden onset at 0100. Woke me up and then had a BM with "massive diarrhea". States, "My heart started racing, felt sob". (denies: fever, cp, cough, congestion, nv, urinary sx, vaginal sx or bleeding), no meds PTA. Last ate 1900. Seen recently for similar, seen by GI MD ~ 2 weeks ago, "said my gall bladder was OK"

## 2015-08-03 IMAGING — NM NM HEPATO W/GB/PHARM/[PERSON_NAME]
3 series · 13 of 13 positions shown · non-contrast
Comparison: 11/25/2013.

CLINICAL DATA: Abdominal pain.

EXAM:
NUCLEAR MEDICINE HEPATOBILIARY IMAGING WITH GALLBLADDER EF
TECHNIQUE: Sequential images of the abdomen were obtained [DATE] minutes
following intravenous administration of radiopharmaceutical. After
slow intravenous infusion of 2.26 micrograms Cholecystokinin,
gallbladder ejection fraction was determined.
RADIOPHARMACEUTICALS:  5.0 Millicurie Xc-22m Choletec

[he hepatobiliary · 1 of 1 slices shown (1 of 3)]
[im 1/1]
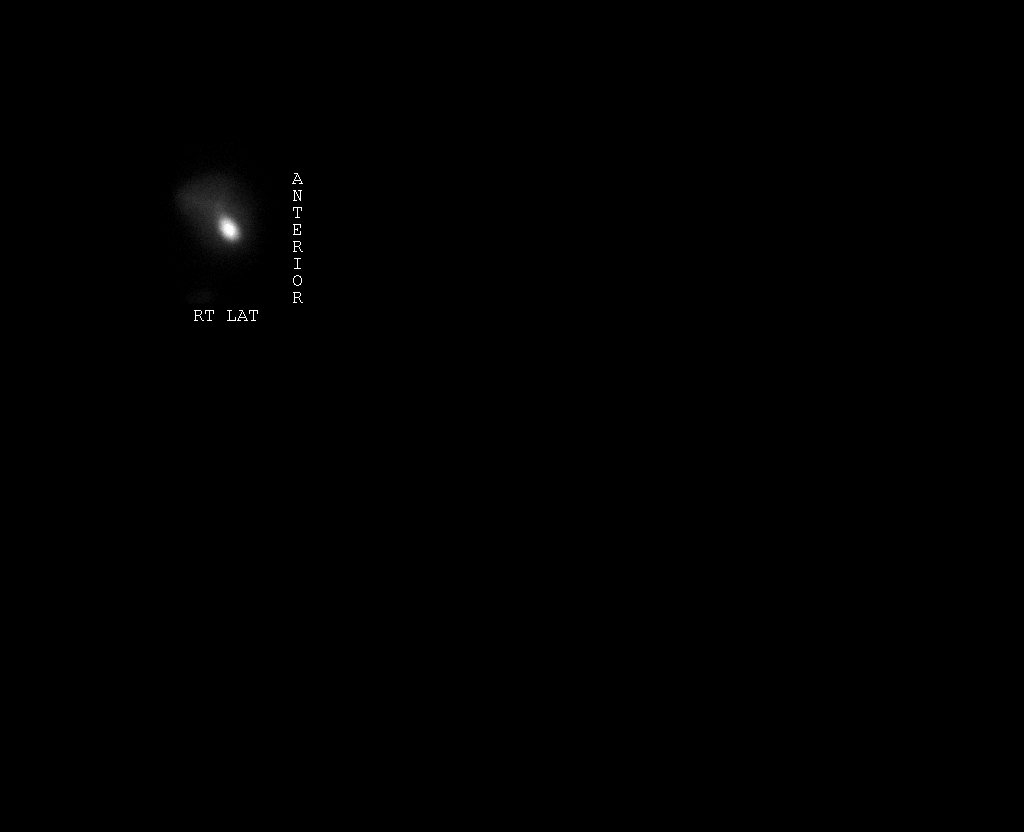

[he hepatobiliary · 3.43mm/px · 6 of 58 frames shown (2 of 3)]
[frame 5/58]
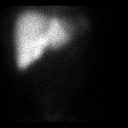
[frame 15/58]
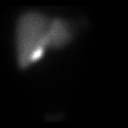
[frame 25/58]
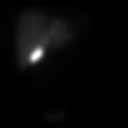
[frame 34/58]
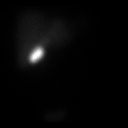
[frame 44/58]
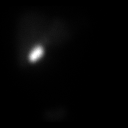
[frame 54/58]
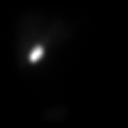

[he hepatobiliary · 3.43mm/px · 6 of 30 frames shown (3 of 3)]
[frame 3/30]
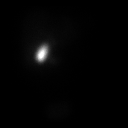
[frame 8/30]
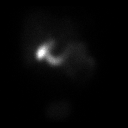
[frame 13/30]
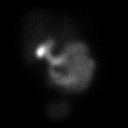
[frame 18/30]
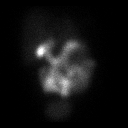
[frame 23/30]
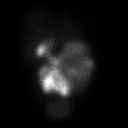
[frame 28/30]
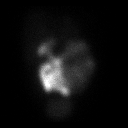

[13 of 13 positions shown; findings below may reference images not displayed]

FINDINGS: Normal hepatic uptake. Common bile duct activity was identified at
10 min. Gallbladder activity also identified at 10 min. There is
normal filling of the gallbladder. Gallbladder ejection fraction was
90%, normal. At 30 min, normal ejection fraction is greater than
30%.

Normal excretion of radiotracer in the bowel.

The patient did experience symptoms during CCK infusion.
IMPRESSION: Normal hepatobiliary study with reproduction of symptoms with CCK
administration.

## 2019-08-16 ENCOUNTER — Ambulatory Visit (HOSPITAL_COMMUNITY)
Admission: EM | Admit: 2019-08-16 | Discharge: 2019-08-16 | Disposition: A | Discharge status: Home / Self Care | Payer: Medicaid Other | Attending: Family Medicine | Admitting: Family Medicine

## 2019-08-16 ENCOUNTER — Other Ambulatory Visit: Payer: Self-pay

## 2019-08-16 ENCOUNTER — Ambulatory Visit (INDEPENDENT_AMBULATORY_CARE_PROVIDER_SITE_OTHER): Payer: Medicaid Other

## 2019-08-16 ENCOUNTER — Encounter (HOSPITAL_COMMUNITY): Payer: Self-pay

## 2019-08-16 DIAGNOSIS — I10 Essential (primary) hypertension: Secondary | ICD-10-CM

## 2019-08-16 DIAGNOSIS — M25552 Pain in left hip: Secondary | ICD-10-CM

## 2019-08-16 DIAGNOSIS — R52 Pain, unspecified: Secondary | ICD-10-CM

## 2019-08-16 HISTORY — DX: Essential (primary) hypertension: I10

## 2019-08-16 MED ORDER — LOSARTAN POTASSIUM-HCTZ 50-12.5 MG PO TABS: 1.0000 | ORAL_TABLET | Freq: Every day | ORAL | 1 refills | Status: AC

## 2019-08-16 MED ORDER — METHYLPREDNISOLONE 4 MG PO TBPK: ORAL_TABLET | ORAL | 0 refills | Status: DC

## 2019-08-16 NOTE — ED Provider Notes (Signed)
[place]    CSN: [id number] Arrival date & time: 08/16/19  1035      History   Chief Complaint Chief Complaint  Patient presents with  . Hip Pain    HPI Christine Rowe is a 44 y.o. female.   HPI  Patient states that she was doing some exercise about 3 weeks ago.  She was walking on treadmill and trying to do some squats.  She was trying to improve her health.  She developed pain in her left hip.  Is been getting worse over time.  Now she can hardly bear weight.  She has pain with range of motion.  With some movement she feels popping and clicking.  She had no fall or injury.  No prior history of any specific hip problems.  Past Medical History:  Diagnosis Date  . Colitis   . Hypertension   . Kidney stone   . No pertinent past medical history     Patient Active Problem List   Diagnosis Date Noted  . Pancreatitis, acute 08/18/2012  . SIRS (systemic inflammatory response syndrome) ([place]) 08/18/2012  . Renal insufficiency 08/18/2012  . Diarrhea 08/18/2012    Past Surgical History:  Procedure Laterality Date  . CESAREAN SECTION    . TUBAL LIGATION      OB History   No obstetric history on file.      Home Medications    Prior to Admission medications   Medication Sig Start Date End Date Taking? Authorizing Provider  ibuprofen (ADVIL,MOTRIN) 200 MG tablet Take 800 mg by mouth every 6 (six) hours as needed for moderate pain.   Yes [provider]  losartan-hydrochlorothiazide (HYZAAR) 50-12.5 MG tablet Take 1 tablet by mouth daily. 08/16/19   Raylene Everts, MD  methylPREDNISolone (MEDROL DOSEPAK) 4 MG TBPK tablet tad 08/16/19   Raylene Everts, MD    Family History Family History  Problem Relation Age of Onset  . Hypertension Mother   . Heart failure Mother   . Diabetes Mother   . Hypertension Father     Social History Social History   Tobacco Use  . Smoking status: Never Smoker  . Smokeless tobacco: Never Used  Substance  Use Topics  . Alcohol use: No  . Drug use: No     Allergies   Shellfish-derived products   Review of Systems Review of Systems  Musculoskeletal: Positive for arthralgias and gait problem.     Physical Exam Triage Vital Signs ED Triage Vitals  Enc Vitals Group     BP 08/16/19 1110 (!) 148/91     Pulse Rate 08/16/19 1110 90     Resp 08/16/19 1110 18     Temp 08/16/19 1110 98.1 F (36.7 C)     Temp Source 08/16/19 1110 Oral     SpO2 08/16/19 1110 96 %     Weight --      Height --      Head Circumference --      Peak Flow --      Pain Score 08/16/19 1115 9     Pain Loc --      Pain Edu? --      Excl. in [place]? --    No data found.  Updated Vital Signs BP (!) 148/91 (BP Location: Left Arm)   Pulse 90   Temp 98.1 F (36.7 C) (Oral)   Resp 18   LMP 08/04/2019   SpO2 96%     Physical Exam Constitutional:  General: She is not in acute distress.    Appearance: She is well-developed. She is obese.  HENT:     Head: Normocephalic and atraumatic.     Mouth/Throat:     Comments: Mask is in place Eyes:     Conjunctiva/sclera: Conjunctivae normal.     Pupils: Pupils are equal, round, and reactive to light.  Cardiovascular:     Rate and Rhythm: Normal rate.  Pulmonary:     Effort: Pulmonary effort is normal. No respiratory distress.  Abdominal:     General: There is no distension.     Palpations: Abdomen is soft.  Musculoskeletal:        General: Normal range of motion.     Cervical back: Normal range of motion.     Comments: Left hip has no tenderness over the posterior aspect or greater trochanter.  Patient points to the groin as the area of most of her pain.  She has very limited range of motion secondary to pain, she can extend fully but cannot flex to 90 degrees.  Pain with internal and external rotation.  No leg length discrepancy.  Valgus knees.  Skin:    General: Skin is warm and dry.  Neurological:     Mental Status: She is alert.      UC  Treatments / Results  Labs (all labs ordered are listed, but only abnormal results are displayed) Labs Reviewed - No data to display  EKG   Radiology DG Hip Unilat With Pelvis 2-3 Views Left  Result Date: 08/16/2019 CLINICAL DATA:  Left hip pain since 07/16/2019.  No known injury. EXAM: DG HIP (WITH OR WITHOUT PELVIS) 2-3V LEFT COMPARISON:  Pelvic radiograph dated 10/28/2012 FINDINGS: There is no evidence of hip fracture or dislocation. There is no evidence of arthropathy or other focal bone abnormality. IMPRESSION: Normal exam. Electronically Signed   By: Francene Boyers M.D.   On: 08/16/2019 11:52    Procedures Procedures (including critical care time)  Medications Ordered in UC Medications - No data to display  Initial Impression / Assessment and Plan / UC Course  I have reviewed the triage vital signs and the nursing notes.  Pertinent labs & imaging results that were available during my care of the patient were reviewed by me and considered in my medical decision making (see chart for details).     Hip pain.  Likely strain.  Will treat with steroids and anti-inflammatories.  Follow-up with the PCP. Elevated blood pressure.  Patient is out of her medicine.  This is refilled, again to follow-up with PCP Final Clinical Impressions(s) / UC Diagnoses   Final diagnoses:  Pain  Left hip pain  Essential hypertension     Discharge Instructions     Limit walking while your hip is painful Take the Medrol pack as directed.  This is a steroid anti-inflammatory to take down pain Take all of day 1 today After you have completed the Medrol take Aleve 2 pills morning and 2 pills at night.  Take with food I have refilled your blood pressure medication You need to follow-up with your primary care doctor    ED Prescriptions    Medication Sig Dispense Auth. Provider   losartan-hydrochlorothiazide (HYZAAR) 50-12.5 MG tablet Take 1 tablet by mouth daily. 30 tablet Eustace Moore, MD    methylPREDNISolone (MEDROL DOSEPAK) 4 MG TBPK tablet tad 21 tablet Eustace Moore, MD     PDMP not reviewed this encounter.   Eustace Moore,  MD 08/16/19 1452

## 2019-08-16 NOTE — ED Triage Notes (Signed)
Pt c/o left groin/hip pain x3 weeks s/p working out. States she hears "little pops" when moving, especially from seated to standing/walking position. Positive sensation to toes.  States she was taking losartan and HCTZ for HTN, but out of Rx. Just moved back to area and needs to establish care with PMD.

## 2019-08-16 NOTE — Discharge Instructions (Signed)
Limit walking while your hip is painful Take the Medrol pack as directed.  This is a steroid anti-inflammatory to take down pain Take all of day 1 today After you have completed the Medrol take Aleve 2 pills morning and 2 pills at night.  Take with food I have refilled your blood pressure medication You need to follow-up with your primary care doctor

## 2019-08-24 ENCOUNTER — Encounter: Payer: Self-pay | Admitting: Sports Medicine

## 2019-08-24 ENCOUNTER — Other Ambulatory Visit: Payer: Self-pay

## 2019-08-24 ENCOUNTER — Ambulatory Visit (HOSPITAL_COMMUNITY)
Admission: EM | Admit: 2019-08-24 | Discharge: 2019-08-24 | Disposition: A | Discharge status: Home / Self Care | Payer: Medicaid Other | Attending: Family Medicine | Admitting: Family Medicine

## 2019-08-24 ENCOUNTER — Ambulatory Visit (INDEPENDENT_AMBULATORY_CARE_PROVIDER_SITE_OTHER): Payer: Medicaid Other | Admitting: Sports Medicine

## 2019-08-24 ENCOUNTER — Encounter (HOSPITAL_COMMUNITY): Payer: Self-pay

## 2019-08-24 VITALS — BP 136/89 | Ht 61.0 in | Wt 272.0 lb

## 2019-08-24 DIAGNOSIS — S76212A Strain of adductor muscle, fascia and tendon of left thigh, initial encounter: Secondary | ICD-10-CM

## 2019-08-24 DIAGNOSIS — M25552 Pain in left hip: Secondary | ICD-10-CM | POA: Diagnosis present

## 2019-08-24 MED ORDER — MELOXICAM 15 MG PO TABS: ORAL_TABLET | ORAL | 0 refills | Status: AC

## 2019-08-24 MED ORDER — TRAMADOL HCL 50 MG PO TABS: 50.0000 mg | ORAL_TABLET | Freq: Four times a day (QID) | ORAL | 0 refills | Status: AC | PRN

## 2019-08-24 MED ORDER — METHYLPREDNISOLONE ACETATE 40 MG/ML IJ SUSP
40.0000 mg | Freq: Once | INTRAMUSCULAR | Status: AC
  Administered 2019-08-24: 40 mg via INTRA_ARTICULAR

## 2019-08-24 MED ORDER — IBUPROFEN 800 MG PO TABS: 800.0000 mg | ORAL_TABLET | Freq: Three times a day (TID) | ORAL | 0 refills | Status: AC

## 2019-08-24 NOTE — Patient Instructions (Signed)
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 -The steroid injection given today in your hip will help improve your pain.  The injection may take several days before it has an effect -I have sent in a prescription for meloxicam to take for the pain.  This is a strong anti-inflammatory.  Do not take it with ibuprofen or Aleve -We have sent in a prescription for physical therapy.  This will help with your hip strengthening and help improve your pain  I will see back in 4 to 6 weeks

## 2019-08-24 NOTE — Discharge Instructions (Signed)
Be aware, you have been prescribed pain medications that may cause drowsiness. Do not combine with alcohol or other illicit drugs. Please do not drive, operate heavy machinery, or take part in activities that require making important decisions while on this medication as your judgement may be clouded.  

## 2019-08-24 NOTE — ED Triage Notes (Signed)
Pt reports she was seen at this UC for left hip pain and groin pain, 1 week ago. Pt reports she finished the Medrol dosepak and helped for 2-3 days. Pt reports pain started getting worse 3 days ago, the pain is constant and she can not find a position for relief the pain.

## 2019-08-24 NOTE — Progress Notes (Addendum)
PCP: Leilani Able, MD  Subjective:   HPI: Patient is a 44 y.o. female here for left hip pain.  Patient notes pain is been present for the last 3 weeks.  Is located in her left groin.  Pain does not radiate.  She denies any injury or trauma.  Patient notes she has been doing squats as well as running which seems to aggravate her pain.  She has pain with hip flexion and with internal rotation of her hip.  She denies any numbness or tingling.  She denies any bruising or swelling.  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  Patient was given a course of prednisone which not improve her pain.   Review of Systems: See HPI above.  Past Medical History:  Diagnosis Date  . Colitis   . Hypertension   . Kidney stone   . No pertinent past medical history     Current Outpatient Medications on File Prior to Visit  Medication Sig Dispense Refill  . ibuprofen (ADVIL) 800 MG tablet Take 1 tablet (800 mg total) by mouth 3 (three) times daily with meals. 21 tablet 0  . losartan-hydrochlorothiazide (HYZAAR) 50-12.5 MG tablet Take 1 tablet by mouth daily. 30 tablet 1  . traMADol (ULTRAM) 50 MG tablet Take 1 tablet (50 mg total) by mouth every 6 (six) hours as needed. 15 tablet 0   No current facility-administered medications on file prior to visit.    Past Surgical History:  Procedure Laterality Date  . CESAREAN SECTION    . TUBAL LIGATION      Allergies  Allergen Reactions  . Shellfish-Derived Products Anaphylaxis    Social History   Socioeconomic History  . Marital status: Widowed    Spouse name: Not on file  . Number of children: Not on file  . Years of education: Not on file  . Highest education level: Not on file  Occupational History  . Not on file  Tobacco Use  . Smoking status: Never Smoker  . Smokeless tobacco: Never Used  Substance and Sexual Activity  . Alcohol use: No  . Drug use: No  .  Sexual activity: Not on file  Other Topics Concern  . Not on file  Social History Narrative  . Not on file   Social Determinants of Health   Financial Resource Strain:   . Difficulty of Paying Living Expenses: Not on file  Food Insecurity:   . Worried About Programme researcher, broadcasting/film/video in the Last Year: Not on file  . Ran Out of Food in the Last Year: Not on file  Transportation Needs:   . Lack of Transportation (Medical): Not on file  . Lack of Transportation (Non-Medical): Not on file  Physical Activity:   . Days of Exercise per Week: Not on file  . Minutes of Exercise per Session: Not on file  Stress:   . Feeling of Stress : Not on file  Social Connections:   . Frequency of Communication with Friends and Family: Not on file  . Frequency of Social Gatherings with Friends and Family: Not on file  . Attends Religious Services: Not on file  . Active Member of Clubs or Organizations: Not on file  . Attends Banker Meetings: Not on file  . Marital Status: Not on file  Intimate Partner Violence:   . Fear of Current or Ex-Partner: Not on file  . Emotionally Abused:  Not on file  . Physically Abused: Not on file  . Sexually Abused: Not on file    Family History  Problem Relation Age of Onset  . Hypertension Mother   . Heart failure Mother   . Diabetes Mother   . Hypertension Father         Objective:  Physical Exam: BP 136/89   Ht 5\' 1"  (1.549 m)   Wt 272 lb (123.4 kg)   LMP 08/04/2019   BMI 51.39 kg/m  Gen: NAD, comfortable in exam room Lungs: Breathing comfortably on room air Hip Exam Left -Inspection: No deformity, no discoloration -Palpation: Tenderness palpation -ROM: Limited range of motion with hip flexion and internal rotation.  This is limited due to pain -Strength: Flexion: 5/5; Abduction: 5/5 -Special Tests: Obers: Negative; FABER: Negative; FADIR: Negative; Log roll: Positive -Limb neurovascularly intact    Assessment & Plan:  Patient is a 44  y.o. female here for evaluation of left hip pain  1.  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  Exam findings are also consistent with hip flexor tendinitis -Given the severity of her hip pain as well as the presence of pincer deformity consent was obtained for corticosteroid injection of her left hip.  Timeout was performed prior to the procedure.  The skin was anesthetized using 3 cc of lidocaine without epinephrine.  Following this 80 mg Depo-Medrol and 4 cc of lidocaine were injected into the hip using ultrasound guidance and sterile technique.  Patient tolerated the injection well there are no complications. -Patient was referred to physical therapy to work on hip strengthening range of motion -Prescription was sent in for meloxicam  Patient will follow up in 4 to 6 weeks  Addendum:  I was the preceptor for this visit and available for immediate consultation.  Karlton Lemon MD Kirt Boys

## 2019-08-24 NOTE — ED Provider Notes (Signed)
[facility]   [id number] 08/24/19 Arrival Time: 3810  ASSESSMENT & PLAN:  1. Strain of groin, left, initial encounter     No indications for plain imaging at this time.  Begin trial of: Meds ordered this encounter  Medications  . traMADol (ULTRAM) 50 MG tablet    Sig: Take 1 tablet (50 mg total) by mouth every 6 (six) hours as needed.    Dispense:  15 tablet    Refill:  0  . ibuprofen (ADVIL) 800 MG tablet    Sig: Take 1 tablet (800 mg total) by mouth 3 (three) times daily with meals.    Dispense:  21 tablet    Refill:  0    Encourage mobility as tolerated.  Recommend: Follow-up Information    Schedule an appointment as soon as possible for a visit  with [facility].   Contact information: [place] [phone]          [place] Controlled Substances Registry consulted for this patient. I feel the risk/benefit ratio today is favorable for proceeding with this prescription for a controlled substance. Medication sedation precautions given.    Discharge Instructions     Be aware, you have been prescribed pain medications that may cause drowsiness. Do not combine with alcohol or other illicit drugs. Please do not drive, operate heavy machinery, or take part in activities that require making important decisions while on this medication as your judgement may be clouded.      Reviewed expectations re: course of current medical issues. Questions answered. Outlined signs and symptoms indicating need for more acute intervention. Patient verbalized understanding. After Visit Summary given.  SUBJECTIVE: History from: patient. Christine Rowe is a 44 y.o. female who reports fairly persistent left groin pain; seen here on 08/16/2019; note reviewed. Feels steroid helped a little "but pain has come back". Describes as "just sore" in L groin; without radiation. Onset: gradual. First noted: 3-4  w ago. Injury/trama: none known; had been walking on treadmill and doing squats. Symptoms have progressed to a point and plateaued since beginning. Aggravating factors: certain movements and prolonged walking/standing. Alleviating factors: have not been identified. Associated symptoms: none reported. Extremity sensation changes or weakness: none. No change in bowel/bladder habits.   Past Surgical History:  Procedure Laterality Date  . CESAREAN SECTION    . TUBAL LIGATION        OBJECTIVE:  Vitals:   08/24/19 1141  BP: 132/88  Pulse: 99  Resp: 18  Temp: 98.9 F (37.2 C)  TempSrc: Oral  SpO2: 100%    General appearance: alert; no distress HEENT: [place]; AT Neck: supple with FROM Resp: unlabored respirations Extremities: . LLE: warm with well perfused appearance; well localized moderate tenderness over left groin musculature; without gross deformities; swelling: none; bruising: none; no masses appreciated; ROM: normal but with reported pain; able to climb onto exam table without assistance. CV: brisk extremity capillary refill of LLE Skin: warm and dry; no visible rashes Neurologic: gait normal but favors LLE; normal sensation and strength of LLE Psychological: alert and cooperative; normal mood and affect    Allergies  Allergen Reactions  . Shellfish-Derived Products Anaphylaxis    Past Medical History:  Diagnosis Date  . Colitis   . Hypertension   . Kidney stone   . No pertinent past medical history    Social History   Socioeconomic History  . Marital status: Widowed    Spouse  name: Not on file  . Number of children: Not on file  . Years of education: Not on file  . Highest education level: Not on file  Occupational History  . Not on file  Tobacco Use  . Smoking status: Never Smoker  . Smokeless tobacco: Never Used  Substance and Sexual Activity  . Alcohol use: No  . Drug use: No  . Sexual activity: Not on file  Other Topics Concern  . Not on file    Social History Narrative  . Not on file   Social Determinants of Health   Financial Resource Strain:   . Difficulty of Paying Living Expenses: Not on file  Food Insecurity:   . Worried About Programme researcher, broadcasting/film/video in the Last Year: Not on file  . Ran Out of Food in the Last Year: Not on file  Transportation Needs:   . Lack of Transportation (Medical): Not on file  . Lack of Transportation (Non-Medical): Not on file  Physical Activity:   . Days of Exercise per Week: Not on file  . Minutes of Exercise per Session: Not on file  Stress:   . Feeling of Stress : Not on file  Social Connections:   . Frequency of Communication with Friends and Family: Not on file  . Frequency of Social Gatherings with Friends and Family: Not on file  . Attends Religious Services: Not on file  . Active Member of Clubs or Organizations: Not on file  . Attends Banker Meetings: Not on file  . Marital Status: Not on file   Family History  Problem Relation Age of Onset  . Hypertension Mother   . Heart failure Mother   . Diabetes Mother   . Hypertension Father    Past Surgical History:  Procedure Laterality Date  . CESAREAN SECTION    . TUBAL LIGATION        Mardella Layman, MD 08/24/19 [phone]

## 2019-09-12 ENCOUNTER — Other Ambulatory Visit: Payer: Self-pay

## 2019-09-12 ENCOUNTER — Ambulatory Visit: Payer: Medicaid Other | Attending: Family Medicine

## 2019-09-12 DIAGNOSIS — M25552 Pain in left hip: Secondary | ICD-10-CM | POA: Insufficient documentation

## 2019-09-12 DIAGNOSIS — M6281 Muscle weakness (generalized): Secondary | ICD-10-CM | POA: Insufficient documentation

## 2019-09-13 NOTE — Therapy (Signed)
[place], [place], [address] Phone: [phone]   Fax:  [phone]  Physical Therapy Evaluation  Patient Details  Name: Christine Rowe MRN: [id number] Date of Birth: 12/31/1975 Referring Provider (PT): Dyann Ruddle, MD   Encounter Date: 09/12/2019  PT End of Session - 09/13/19 1209    Visit Number  1    Number of Visits  4    Date for PT Re-Evaluation  09/26/19    Authorization Type  Medicaid    PT Start Time  0900    PT Stop Time  1000    PT Time Calculation (min)  60 min    Activity Tolerance  Patient tolerated treatment well    Behavior During Therapy  [facility] for tasks assessed/performed       Past Medical History:  Diagnosis Date  . Colitis   . Hypertension   . Kidney stone   . No pertinent past medical history     Past Surgical History:  Procedure Laterality Date  . CESAREAN SECTION    . TUBAL LIGATION      There were no vitals filed for this visit.   Subjective Assessment - 09/13/19 1201    Subjective  Onset of L hip pain approx mid January which is aggrevated by sit/stand, c sitting wrose than standing, and certain L hip movements, Initially, a pain level of 10/10 and currently 7-8/10. Additionally, pt reports clicking of the L hip.    Limitations  Lifting;Standing;Walking;Sitting    How long can you sit comfortably?  3-4 hours    How long can you stand comfortably?  1 hour    How long can you walk comfortably?  30 mins    Patient Stated Goals  For L hip pain to improve and return to being more active and exercising.    Currently in Pain?  Yes    Pain Score  8     Pain Location  Hip    Pain Orientation  Left    Pain Descriptors / Indicators  Constant;Aching;Sharp    Pain Type  Chronic pain    Pain Onset  More than a month ago    Pain Frequency  Constant    Aggravating Factors   Sit to/from satnding and certain L hips movements         [facility] PT Assessment - 09/13/19 0001       Assessment   Medical Diagnosis  L hip pain    Referring Provider (PT)  Dyann Ruddle, MD    Onset Date/Surgical Date  --   07/29/19     Precautions   Precautions  None      Restrictions   Weight Bearing Restrictions  No      Balance Screen   Has the patient fallen in the past 6 months  No      [place] residence    Type of [place] Access  Other (comment)   1 short step   Home Layout  Two level    Alternate Level Stairs-Number of Steps  14    Home Equipment  None      Prior Function   Level of Independence  Independent    Vocation  Self employed    Leisure  Ex program of walking and squats      Cognition   Overall Cognitive Status  Within Functional Limits for tasks assessed  Observation/Other Assessments-Edema    Edema  --   None     Sensation   Additional Comments  Grossly intact for light touch      AROM   Right/Left Hip  --    Right Hip Flexion  --   100d   Right Hip External Rotation   --   20d   Right Hip Internal Rotation   --   20d   Left Hip Flexion  --   56d limited by pain   Left Hip External Rotation   --   20d   Left Hip Internal Rotation   --   20d     PROM   Left Hip Flexion  --   66d limited by pain     Strength   Overall Strength Comments  L hip 3/5 limited by pain; R 5/5      Special Tests   Other special tests  posterior labrum test positive   Posterior labrum test +; L SLR 62d, R 75d     Transfers   Five time sit to stand comments   --   42.9 sec c pace limited by pain     Ambulation/Gait   Gait Pattern  --   Min antalgic gait pattern over L LE   Gait velocity  3.4 ft/sec    Gait Comments  2 min walking test 426ft                Objective measurements completed on examination: See above findings.      [facility] Adult PT Treatment/Exercise - 09/13/19 0001      Knee/Hip Exercises: Stretches   Passive Hamstring Stretch  Both;10 seconds    Passive  Hamstring Stretch Limitations  10 reps    Other Knee/Hip Stretches  --   Single KTC 10 reps, press on/off and/or 10 sec hold;use belt   Other Knee/Hip Stretches  --   Trunk/hip rotation 10 x;pressure on/off and or 10 sec hold            PT Education - 09/12/19 1524    Education Details  HEP; positional recommendations for symptom management    Person(s) Educated  Patient    Methods  Explanation;Demonstration;Handout;Tactile cues    Comprehension  Verbalized understanding;Returned demonstration;Tactile cues required;Verbal cues required       PT Short Term Goals - 09/13/19 0940      PT SHORT TERM GOAL #1   Title  Pt will be ind in a HEP to address L hip ROM, function, and pain.    Baseline  initial HEP started on eval    Time  3    Period  Weeks    Status  New    Target Date  10/04/19        PT Long Term Goals - 09/13/19 0943      PT LONG TERM GOAL #1   Title  Pt will report a pain level range of 0-5/10 with daily acctivities.    Baseline  7-8/10    Time  9    Period  Weeks    Status  New    Target Date  11/15/19      PT LONG TERM GOAL #2   Title  L hip AROM will increase to 90d for improved tolerance and quality of STS activities.    Baseline  L hip AROM 50d limited by pain    Time  9    Period  Weeks    Status  New    Target Date  11/15/19      PT LONG TERM GOAL #3   Title  5x STS test will improve to 30 sec or less to reflect improved function and pain of the L hip.    Baseline  5x STS 42.9 sec    Time  9    Period  Weeks    Status  New    Target Date  11/15/19      PT LONG TERM GOAL #4   Title  Pt will voiced understanding of positional and activity factors which positively/negatively impact her L hip pain    Baseline  Decreased understanding    Time  9    Period  Weeks    Status  New    Target Date  11/15/19             Plan - 09/13/19 1211    Clinical Impression Statement  L hip pain provoked by active, passive and resisted L hip  flexion and posterior labral test. Pain affects sleep and sitting, standing and walking with the most significant being moving from standing to sitting.Pt walks with out toeing and a mild antalgic gait over the L LE. Pt will benefit from PT to address pain and functional mobility and tolerance.    Personal Factors and Comorbidities  Comorbidity 1    Comorbidities  obese    Examination-Activity Limitations  Lift;Squat;Bend;Stairs;Dressing;Transfers;Stand;Locomotion Level;Bed Mobility;Sleep;Sit    Examination-Participation Restrictions  Cleaning;Laundry;Shop;Yard Work;Other    Stability/Clinical Decision Making  Stable/Uncomplicated    Clinical Decision Making  Low    Rehab Potential  Good    PT Frequency  Other (comment)   2w1, 1w1   PT Duration  2 weeks    PT Treatment/Interventions  Electrical Stimulation;Iontophoresis 4mg /ml Dexamethasone;Moist Heat;Gait training;Stair training;Therapeutic activities;Therapeutic exercise;Balance training;Patient/family education;Manual techniques;Traction;Passive range of motion;Dry needling;Energy conservation    PT Next Visit Plan  Review and progress HEP. PROM, AROM, strengthening, stretching, distraction, modalities as needed to address L hip mobility and pain.    PT Home Exercise Plan  [id number]    Consulted and Agree with Plan of Care  Patient       Patient will benefit from skilled therapeutic intervention in order to improve the following deficits and impairments:  Improper body mechanics, Pain, Decreased mobility, Decreased activity tolerance, Decreased range of motion, Decreased strength, Difficulty walking, Impaired flexibility  Visit Diagnosis: Pain in left hip - Plan: PT plan of care cert/re-cert  Muscle weakness (generalized) - Plan: PT plan of care cert/re-cert     Problem List Patient Active Problem List   Diagnosis Date Noted  . Pancreatitis, acute 08/18/2012  . SIRS (systemic inflammatory response syndrome) (HCC) 08/18/2012  .  Renal insufficiency 08/18/2012  . Diarrhea 08/18/2012    10/16/2012, MS, PT 09/13/2019, 12:22 PM  [facility] [address] [place], [place], [place] Phone: [phone]   Fax:  [phone]  Name: Christine Rowe MRN: [name] Date of Birth: 26-Aug-1975

## 2019-09-21 ENCOUNTER — Ambulatory Visit: Payer: Medicaid Other | Admitting: Sports Medicine

## 2019-09-21 ENCOUNTER — Other Ambulatory Visit: Payer: Self-pay

## 2019-09-21 ENCOUNTER — Ambulatory Visit: Payer: Medicaid Other

## 2019-09-21 DIAGNOSIS — M6281 Muscle weakness (generalized): Secondary | ICD-10-CM

## 2019-09-21 DIAGNOSIS — M25552 Pain in left hip: Secondary | ICD-10-CM | POA: Diagnosis not present

## 2019-09-21 NOTE — Therapy (Signed)
[facility] [address] [place], [place], [address] Phone: [phone]   Fax:  [phone]  Physical Therapy Treatment  Patient Details  Name: Christine Rowe MRN: [id number] Date of Birth: 02-19-1976 Referring Provider (PT): Wonda Olds, MD   Encounter Date: 09/21/2019  PT End of Session - 09/21/19 0950    Visit Number  2    Number of Visits  4    Date for PT Re-Evaluation  09/26/19    Authorization Type  Medicaid    Authorization - Visit Number  1    Authorization - Number of Visits  3    PT Start Time  0845    PT Stop Time  0930    PT Time Calculation (min)  45 min    Activity Tolerance  Patient tolerated treatment well    Behavior During Therapy  [facility] for tasks assessed/performed       Past Medical History:  Diagnosis Date  . Colitis   . Hypertension   . Kidney stone   . No pertinent past medical history     Past Surgical History:  Procedure Laterality Date  . CESAREAN SECTION    . TUBAL LIGATION      There were no vitals filed for this visit.  Subjective Assessment - 09/21/19 0903    Subjective  Pt reports being consistent with her HEP. Pt states her L hip pain is the same experiencing aching and incidents of sharp pain esp. with hip add and hip IR.Marland Kitchen    Limitations  Lifting;Standing;Walking;Sitting    How long can you sit comfortably?  3-4 hours    How long can you stand comfortably?  1 hour    How long can you walk comfortably?  1 hour    Currently in Pain?  Yes    Pain Score  7     Pain Location  Hip    Pain Orientation  Left    Pain Descriptors / Indicators  Aching;Sharp    Pain Type  Chronic pain    Pain Radiating Towards  NA    Pain Onset  More than a month ago    Pain Frequency  Constant    Aggravating Factors   Prolonged sitting; hip add and hip IR    Pain Relieving Factors  Pain med    Effect of Pain on Daily Activities  Completes ADLs c pain    Multiple Pain Sites  No                        [facility] Adult PT Treatment/Exercise - 09/21/19 0001      Knee/Hip Exercises: Stretches   Passive Hamstring Stretch  Left;3 reps;10 seconds;Limitations    Passive Hamstring Stretch Limitations  knee flex to SLR to decrease strain    Hip Flexor Stretch  Left;10 seconds;Limitations    Hip Flexor Stretch Limitations  With seat of chair; 10reps    Piriformis Stretch  Left;10 seconds;Limitations    Piriformis Stretch Limitations  10 reps    Other Knee/Hip Stretches  Supine ER stretch; 10 reps; 10 sec    Other Knee/Hip Stretches  supine KTC;10 x;10 sec              PT Education - 09/21/19 0948    Education Details  Reviewed previous and new HEP. Ed on sitting and sleeping positions and how to get in/ou of bed to lessen pain.    Person(s) Educated  Patient  Methods  Explanation;Demonstration;Tactile cues;Verbal cues    Comprehension  Verbalized understanding;Returned demonstration;Verbal cues required;Tactile cues required       PT Short Term Goals - 09/13/19 0940      PT SHORT TERM GOAL #1   Title  Pt will be ind in a HEP to address L hip ROM, function, and pain.    Baseline  initial HEP started on eval    Time  3    Period  Weeks    Status  New    Target Date  10/04/19        PT Long Term Goals - 09/13/19 0943      PT LONG TERM GOAL #1   Title  Pt will report a pain level range of 0-5/10 with daily acctivities.    Baseline  7-8/10    Time  9    Period  Weeks    Status  New    Target Date  11/15/19      PT LONG TERM GOAL #2   Title  L hip AROM will increase to 90d for improved tolerance and quality of STS activities.    Baseline  L hip AROM 50d limited by pain    Time  9    Period  Weeks    Status  New    Target Date  11/15/19      PT LONG TERM GOAL #3   Title  5x STS test will improve to 30 sec or less to reflect improved function and pain of the L hip.    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    Personal Factors and Comorbidities  Comorbidity 1    PT Treatment/Interventions  Electrical Stimulation;Iontophoresis 4mg /ml Dexamethasone;Moist Heat;Gait training;Stair training;Therapeutic activities;Therapeutic exercise;Balance training;Patient/family education;Manual techniques;Traction;Passive range of motion;Dry needling;Energy conservation    PT Next Visit Plan  Continue OM and stretching exs for the L hip; recumbent bike or nustep; hip distraction    PT Home Exercise Plan  [facility]       Patient will benefit from skilled therapeutic intervention in order to improve the following deficits and impairments:  Improper body mechanics, Pain, Decreased mobility, Decreased activity tolerance, Decreased range of motion, Decreased strength, Difficulty walking, Impaired flexibility  Visit Diagnosis: Pain in left hip  Muscle weakness (generalized)     Problem List Patient Active Problem List   Diagnosis Date Noted  . Pancreatitis, acute 08/18/2012  . SIRS (systemic inflammatory response syndrome) ([place]) 08/18/2012  . Renal insufficiency 08/18/2012  . Diarrhea 08/18/2012    [facility] [address] [place], [place], [address] Phone: [phone]   Fax:  [phone]  Name: Christine Rowe MRN: [id number] Date of Birth: 12-Sep-1975  Yaslin Kirtley MS, PT 09/21/19 10:09 AM

## 2019-09-23 ENCOUNTER — Other Ambulatory Visit: Payer: Self-pay

## 2019-09-23 ENCOUNTER — Ambulatory Visit: Payer: Medicaid Other

## 2019-09-23 DIAGNOSIS — M6281 Muscle weakness (generalized): Secondary | ICD-10-CM

## 2019-09-23 DIAGNOSIS — M25552 Pain in left hip: Secondary | ICD-10-CM | POA: Diagnosis not present

## 2019-09-23 NOTE — Patient Instructions (Signed)
L hip distraction by standing on step with R LE and letting her L LE dangle. If helpful sue of a weight would be benficial.

## 2019-09-23 NOTE — Therapy (Signed)
[place], [place], [address] Phone: [phone]   Fax:  [phone]  Physical Therapy Treatment  Patient Details  Name: Christine Rowe MRN: [id number] Date of Birth: [birth year] Referring Provider (PT): Dyann Ruddle, MD   Encounter Date: 09/23/2019  PT End of Session - 09/23/19 0855    Visit Number  3    Number of Visits  4    Date for PT Re-Evaluation  09/26/19    Authorization Type  Medicaid    Authorization - Visit Number  2    Authorization - Number of Visits  3    PT Start Time  0830    PT Stop Time  0915    PT Time Calculation (min)  45 min    Activity Tolerance  Patient tolerated treatment well    Behavior During Therapy  [facility] for tasks assessed/performed       Past Medical History:  Diagnosis Date  . Colitis   . Hypertension   . Kidney stone   . No pertinent past medical history     Past Surgical History:  Procedure Laterality Date  . CESAREAN SECTION    . TUBAL LIGATION      There were no vitals filed for this visit.  Subjective Assessment - 09/23/19 0838    Subjective  Pt reports positional and activity modifications are helping to reduce the incidences of her sharp L hip pain. Pt reports she still has episode of higher pain.    Currently in Pain?  Yes    Pain Score  8     Pain Location  Hip    Pain Orientation  Left    Pain Descriptors / Indicators  Aching;Sharp    Pain Type  Acute pain;Chronic pain    Pain Radiating Towards  NA    Pain Onset  More than a month ago    Pain Frequency  Intermittent                               PT Education - 09/23/19 0844    Education Details  Continued education re: sleeping, turning, bending and standing positions and modifications to decrease L hip pain and incidences of pinching sensation.    Methods  Explanation;Demonstration;Tactile cues;Verbal cues    Comprehension  Verbalized understanding;Returned  demonstration;Verbal cues required;Tactile cues required;Need further instruction       PT Short Term Goals - 09/13/19 0940      PT SHORT TERM GOAL #1   Title  Pt will be ind in a HEP to address L hip ROM, function, and pain.    Baseline  initial HEP started on eval    Time  3    Period  Weeks    Status  New    Target Date  10/04/19        PT Long Term Goals - 09/13/19 0943      PT LONG TERM GOAL #1   Title  Pt will report a pain level range of 0-5/10 with daily acctivities.    Baseline  7-8/10    Time  9    Period  Weeks    Status  New    Target Date  11/15/19      PT LONG TERM GOAL #2   Title  L hip AROM will increase to 90d for improved tolerance and quality of STS activities.    Baseline  L  hip AROM 50d limited by pain    Time  9    Period  Weeks    Status  New    Target Date  11/15/19      PT LONG TERM GOAL #3   Title  5x STS test will improve to 30 sec or less to reflect improved function and pain of the L hip.    Baseline  5x STS 42.9 sec    Time  9    Period  Weeks    Status  New    Target Date  11/15/19      PT LONG TERM GOAL #4   Title  Pt will voiced understanding of positional and activity factors which positively/negatively impact her L hip pain    Baseline  Decreased understanding    Time  9    Period  Weeks    Status  New    Target Date  11/15/19            Plan - 09/23/19 0857    Clinical Impression Statement  Pt reports less incidences of sharp/pinching L hip pain, but overall pain level can still be elevated. Pt reports consistent completion HEP. Pt responded positively to distaction to the L hip.    Personal Factors and Comorbidities  Comorbidity 1    Comorbidities  obese    Stability/Clinical Decision Making  Stable/Uncomplicated    Clinical Decision Making  Low    Rehab Potential  Good    PT Treatment/Interventions  Electrical Stimulation;Iontophoresis 4mg /ml Dexamethasone;Moist Heat;Gait training;Stair training;Therapeutic  activities;Therapeutic exercise;Balance training;Patient/family education;Manual techniques;Traction;Passive range of motion;Dry needling;Energy conservation    PT Next Visit Plan  Review Pts respone to HEP and positional/activity modifications and L hip distaction techniques.Complete re-assessment    PT Home Exercise Plan  Complete hip traction in standing. Pt to use weights if able to obtain.       Patient will benefit from skilled therapeutic intervention in order to improve the following deficits and impairments:  Improper body mechanics, Pain, Decreased mobility, Decreased activity tolerance, Decreased range of motion, Decreased strength, Difficulty walking, Impaired flexibility  Visit Diagnosis: Pain in left hip  Muscle weakness (generalized)     Problem List Patient Active Problem List   Diagnosis Date Noted  . Pancreatitis, acute 08/18/2012  . SIRS (systemic inflammatory response syndrome) (HCC) 08/18/2012  . Renal insufficiency 08/18/2012  . Diarrhea 08/18/2012   10/16/2012 MS, PT 09/23/19 12:23 PM  [facility] [address] [place], [place], [place] Phone: [phone]   Fax:  [phone]  Name: Christine Rowe MRN: [name] Date of Birth: 01-13-1976

## 2019-09-28 ENCOUNTER — Other Ambulatory Visit: Payer: Self-pay

## 2019-09-28 ENCOUNTER — Ambulatory Visit: Payer: Medicaid Other

## 2019-09-28 DIAGNOSIS — M25552 Pain in left hip: Secondary | ICD-10-CM | POA: Diagnosis not present

## 2019-09-28 DIAGNOSIS — M6281 Muscle weakness (generalized): Secondary | ICD-10-CM

## 2019-09-28 NOTE — Patient Instructions (Signed)
HEP reviewed and pt is completing it correctly.

## 2019-09-28 NOTE — Therapy (Signed)
[place], [place], [address] Phone: [phone]   Fax:  [phone]  Physical Therapy Treatment  Patient Details  Name: Christine Rowe MRN: [id number] Date of Birth: 10-Jul-1976 Referring Provider (PT): Dyann Ruddle, MD   Encounter Date: 09/28/2019  PT End of Session - 09/28/19 1254    Visit Number  4    Number of Visits  4    Date for PT Re-Evaluation  09/28/19    Authorization Type  Medicaid    Authorization - Visit Number  3    Authorization - Number of Visits  3    PT Start Time  7915    PT Stop Time  0828    PT Time Calculation (min)  39 min    Activity Tolerance  Patient tolerated treatment well;No increased pain    Behavior During Therapy  WFL for tasks assessed/performed       Past Medical History:  Diagnosis Date  . Colitis   . Hypertension   . Kidney stone   . No pertinent past medical history     Past Surgical History:  Procedure Laterality Date  . CESAREAN SECTION    . TUBAL LIGATION      There were no vitals filed for this visit.  Subjective Assessment - 09/28/19 0838    Subjective  Pt reports turning on the L hip last night while sleeping and experienced a sharp pain. Afterwards she had difficulty sleeping and her L hip pain is currently 9/10. Prior to turning, Pt states she was sleeping comfortably on her R side. Overall, pt reports she is having less pain and is managing it better.                       [facility] Adult PT Treatment/Exercise - 09/28/19 0001      Ambulation/Gait   Ambulation Distance (Feet)  424 Feet   2 min walking test     Knee/Hip Exercises: Stretches   Passive Hamstring Stretch  Left;3 reps;10 seconds;Limitations    Passive Hamstring Stretch Limitations  knee flex to SLR to decrease strain    Hip Flexor Stretch  Left;10 seconds;Limitations    Hip Flexor Stretch Limitations  With seat of chair; 10reps    Piriformis Stretch  Left;10  seconds;Limitations    Piriformis Stretch Limitations  10 reps    Other Knee/Hip Stretches  Supine ER stretch; 10 reps; 10 sec    Other Knee/Hip Stretches  supine KTC;10 x;10 sec; AROM 94d        Manual Therapy   Manual Therapy  Joint mobilization    Manual therapy comments  Pt completed L hip distaction in standing c 12lb ankle wt.; 8 inch step; 5 mins    Joint Mobilization  Grade 3,4 L hip distraction, 8 mins             PT Education - 09/28/19 1245    Education Details  With an increase in L hip pain following inadvertently sleeping on her L side last night, educated pt that periodic increases in L hip pain will occur c a pincer hip and to continue to complete her exs as tolerated and manage activities which aggrevate her L hip.    Person(s) Educated  Patient    Methods  Explanation    Comprehension  Verbalized understanding       PT Short Term Goals - 09/28/19 1324      PT SHORT TERM GOAL #  1   Title  Pt will be ind in a HEP to address L hip ROM, function, and pain.    Baseline  Pt is Ind with her initial HEP to address L hip ROM, function, and pain    Status  Achieved        PT Long Term Goals - 09/28/19 1330      PT LONG TERM GOAL #1   Title  Pt will report a pain level range of 0-5/10 with daily acctivities.    Baseline  7-8/10    Status  On-going    Target Date  11/15/19      PT LONG TERM GOAL #2   Title  L hip AROM will increase to 90d for improved tolerance and quality of STS activities.    Baseline  L hip AROM = 94d with pain at end range    Target Date  11/15/19      PT LONG TERM GOAL #3   Title  5x STS test will improve to 30 sec or less to reflect improved function and pain of the L hip.    Baseline  5 STS 44.1 sec    Status  On-going    Target Date  11/15/19      PT LONG TERM GOAL #4   Title  Pt will voiced understanding of positional and activity factors which positively/negatively impact her L hip pain    Baseline  Pt's understanding of  positions and activities which negatively increase her pain is much improved with pt able to recall positions and activities of concern.    Status  Partially Met    Target Date  11/15/19            Plan - 09/28/19 1309    Clinical Impression Statement  Pt reports improved understanding of her condition re: what positions and activities aggrevate her L hip pain. Additionally, pt is Ind in an initial HEP to address flexibilty and pain which the pt is finding beneficial. Pt's pain level is elevated today following obtaining a sleeping position which aggrevated her L hip.. Functional Tests were not significantly improved today with this increase in pain.    Personal Factors and Comorbidities  Comorbidity 1    Comorbidities  obese    Examination-Activity Limitations  Lift;Squat;Bend;Stairs;Dressing;Transfers;Stand;Locomotion Level;Bed Mobility;Sleep;Sit    Examination-Participation Restrictions  Cleaning;Laundry;Shop;Yard Work;Other    Stability/Clinical Decision Making  Stable/Uncomplicated    Clinical Decision Making  Low    Rehab Potential  Good    PT Frequency  2x / week    PT Duration  6 weeks    PT Treatment/Interventions  Electrical Stimulation;Iontophoresis 45m/ml Dexamethasone;Moist Heat;Gait training;Stair training;Therapeutic activities;Therapeutic exercise;Balance training;Patient/family education;Manual techniques;Traction;Passive range of motion;Dry needling;Energy conservation    PT Next Visit Plan  Initiate aerobic and strengthening exs the pt is able to tolerate.    PT Home Exercise Plan  Continue current HEP. Pt obtained 10lb of cuff weights to perform l hip distraction at home.    Consulted and Agree with Plan of Care  Patient       Patient will benefit from skilled therapeutic intervention in order to improve the following deficits and impairments:  Improper body mechanics, Pain, Decreased mobility, Decreased activity tolerance, Decreased range of motion, Decreased  strength, Difficulty walking, Impaired flexibility  Visit Diagnosis: Pain in left hip - Plan: PT plan of care cert/re-cert  Muscle weakness (generalized) - Plan: PT plan of care cert/re-cert     Problem List Patient Active Problem  List   Diagnosis Date Noted  . Pancreatitis, acute 08/18/2012  . SIRS (systemic inflammatory response syndrome) ([place]) 08/18/2012  . Renal insufficiency 08/18/2012  . Diarrhea 08/18/2012   Gar Ponto MS, PT 09/28/19 2:01 PM   [facility] [address] [place], [place], [address] Phone: [phone]   Fax:  [phone]  Name: Kimberli Azuri Bozard MRN: [id number] Date of Birth: 06-03-1976

## 2019-10-10 ENCOUNTER — Ambulatory Visit: Payer: Medicaid Other | Admitting: Physical Therapy

## 2019-10-10 ENCOUNTER — Other Ambulatory Visit: Payer: Self-pay

## 2019-10-10 ENCOUNTER — Encounter: Payer: Self-pay | Admitting: Physical Therapy

## 2019-10-10 DIAGNOSIS — M6281 Muscle weakness (generalized): Secondary | ICD-10-CM

## 2019-10-10 DIAGNOSIS — M25552 Pain in left hip: Secondary | ICD-10-CM

## 2019-10-10 NOTE — Therapy (Signed)
[place], [place], [address] Phone: [phone]   Fax:  [phone]  Physical Therapy Treatment  Patient Details  Name: Christine Rowe MRN: [id number] Date of Birth: 04-01-1976 Referring Provider (PT): Dyann Ruddle, MD   Encounter Date: 10/10/2019  PT End of Session - 10/10/19 0811    Visit Number  5    Number of Visits  12    Date for PT Re-Evaluation  11/18/19    Authorization Type  Medicaid    Authorization - Visit Number  1    Authorization - Number of Visits  8    PT Start Time  0800    PT Stop Time  0851    PT Time Calculation (min)  51 min    Activity Tolerance  Patient limited by pain    Behavior During Therapy  [facility] for tasks assessed/performed       Past Medical History:  Diagnosis Date  . Colitis   . Hypertension   . Kidney stone   . No pertinent past medical history     Past Surgical History:  Procedure Laterality Date  . CESAREAN SECTION    . TUBAL LIGATION      There were no vitals filed for this visit.  Subjective Assessment - 10/10/19 0800    Subjective  Hip has not been aching as bad the past couple days. Pain not as unbearable as it was. Pain wraps around more from anterior hip into proximal lateral hip.    Currently in Pain?  Yes    Pain Score  5     Pain Location  Hip    Pain Orientation  Left    Pain Descriptors / Indicators  Aching;Sharp    Pain Type  Chronic pain    Pain Onset  More than a month ago    Pain Frequency  Intermittent    Aggravating Factors   prolonged sitting, hip add and IR    Pain Relieving Factors  pain medication    Effect of Pain on Daily Activities  increased difficulty ADLs                       [facility] Adult PT Treatment/Exercise - 10/10/19 0001      Knee/Hip Exercises: Stretches   Passive Hamstring Stretch  Left;3 reps;10 seconds;Limitations    Passive Hamstring Stretch Limitations  knee flex to SLR to decrease  strain    Hip Flexor Stretch Limitations  supine gentle left hip flexor stretch left leg off edge of mat 3x20 sec    Piriformis Stretch  Left;3 reps;20 seconds    Other Knee/Hip Stretches  supine ER stretch 3x20 sec    Other Knee/Hip Stretches  attempted SKTC stretch but held due to pain      Knee/Hip Exercises: Supine   Bridges Limitations  partial bridge with legs on reversed incline wedge x 10 reps    Other Supine Knee/Hip Exercises  gentle hip adduction isometric with ball 3 sec x 10 reps    Other Supine Knee/Hip Exercises  clamshell yellow band x 15 reps      Modalities   Modalities  Moist Heat      Moist Heat Therapy   Number Minutes Moist Heat  10 Minutes    Moist Heat Location  Hip   supine with legs on bolster     Manual Therapy   Manual Therapy  Soft tissue mobilization    Joint Mobilization  left hip LAD grade I-IV oscillations, prone ER mobilization grade I-III in figure 4 position with left leg off edge of mat, left knee on stool    Soft tissue mobilization  IASTM/roller use left TFL region in right sidelying               PT Short Term Goals - 09/28/19 1324      PT SHORT TERM GOAL #1   Title  Pt will be ind in a HEP to address L hip ROM, function, and pain.    Baseline  Pt is Ind with her initial HEP to address L hip ROM, function, and pain    Status  Achieved        PT Long Term Goals - 09/28/19 1330      PT LONG TERM GOAL #1   Title  Pt will report a pain level range of 0-5/10 with daily acctivities.    Baseline  7-8/10    Status  On-going    Target Date  11/15/19      PT LONG TERM GOAL #2   Title  L hip AROM will increase to 90d for improved tolerance and quality of STS activities.    Baseline  L hip AROM = 94d with pain at end range    Target Date  11/15/19      PT LONG TERM GOAL #3   Title  5x STS test will improve to 30 sec or less to reflect improved function and pain of the L hip.    Baseline  5 STS 44.1 sec    Status  On-going     Target Date  11/15/19      PT LONG TERM GOAL #4   Title  Pt will voiced understanding of positional and activity factors which positively/negatively impact her L hip pain    Baseline  Pt's understanding of positions and activities which negatively increase her pain is much improved with pt able to recall positions and activities of concern.    Status  Partially Met    Target Date  11/15/19            Plan - 10/10/19 0845    Clinical Impression Statement  Limited tolerance hip ROM into flexion and IR motion (liimted tolerance stretches/joint mobs involving knee flexion) but noting mild improvement from prevoius status with decreased pain. Differential diagnosis still including intrinsic hip issue vs. muscular etiology. Pt. would benefit from continued therapy for further progress to decrease pain and improve mobility tolerance.    Personal Factors and Comorbidities  Comorbidity 1    Comorbidities  obese    Examination-Activity Limitations  Lift;Squat;Bend;Stairs;Dressing;Transfers;Stand;Locomotion Level;Bed Mobility;Sleep;Sit    Examination-Participation Restrictions  Cleaning;Laundry;Shop;Yard Work;Other    Stability/Clinical Decision Making  Stable/Uncomplicated    Clinical Decision Making  Low    Rehab Potential  Good    PT Frequency  2x / week    PT Duration  6 weeks    PT Treatment/Interventions  Electrical Stimulation;Iontophoresis 52m/ml Dexamethasone;Moist Heat;Gait training;Stair training;Therapeutic activities;Therapeutic exercise;Balance training;Patient/family education;Manual techniques;Traction;Passive range of motion;Dry needling;Energy conservation    PT Next Visit Plan  Initiate aerobic and strengthening exs the pt is able to tolerate (trial NUTEP next session).    PT Home Exercise Plan  Continue current HEP. Pt obtained 10lb of cuff weights to perform l hip distraction at home.    Consulted and Agree with Plan of Care  Patient       Patient will benefit from skilled  therapeutic intervention  in order to improve the following deficits and impairments:  Improper body mechanics, Pain, Decreased mobility, Decreased activity tolerance, Decreased range of motion, Decreased strength, Difficulty walking, Impaired flexibility  Visit Diagnosis: Pain in left hip  Muscle weakness (generalized)     Problem List Patient Active Problem List   Diagnosis Date Noted  . Pancreatitis, acute 08/18/2012  . SIRS (systemic inflammatory response syndrome) ([place]) 08/18/2012  . Renal insufficiency 08/18/2012  . Diarrhea 08/18/2012    Beaulah Dinning, PT, DPT 10/10/19 8:48 AM  [facility] [address] [place], [place], [address] Phone: [phone]   Fax:  [phone]  Name: Sharna Laqueisha Catalina MRN: [id number] Date of Birth: October 13, 1975

## 2019-10-12 ENCOUNTER — Ambulatory Visit: Payer: Medicaid Other | Admitting: Physical Therapy

## 2019-10-19 ENCOUNTER — Ambulatory Visit: Payer: Medicaid Other | Attending: Family Medicine

## 2019-10-19 DIAGNOSIS — M6281 Muscle weakness (generalized): Secondary | ICD-10-CM | POA: Insufficient documentation

## 2019-10-19 DIAGNOSIS — M25552 Pain in left hip: Secondary | ICD-10-CM | POA: Insufficient documentation

## 2019-10-24 ENCOUNTER — Ambulatory Visit: Payer: Medicaid Other

## 2019-10-24 ENCOUNTER — Other Ambulatory Visit: Payer: Self-pay

## 2019-10-24 DIAGNOSIS — M6281 Muscle weakness (generalized): Secondary | ICD-10-CM

## 2019-10-24 DIAGNOSIS — M25552 Pain in left hip: Secondary | ICD-10-CM

## 2019-10-24 NOTE — Patient Instructions (Signed)
To continue c current HEP.

## 2019-10-25 NOTE — Therapy (Signed)
[place], [place], [address] Phone: [phone]   Fax:  [phone]  Physical Therapy Treatment  Patient Details  Name: Christine Rowe MRN: [id number] Date of Birth: April 21, 1976 Referring Provider (PT): Dyann Ruddle, MD   Encounter Date: 10/24/2019  PT End of Session - 10/24/19 1101    Visit Number  6    Number of Visits  12    Date for PT Re-Evaluation  11/18/19    Authorization Type  Medicaid    Authorization - Visit Number  2    Authorization - Number of Visits  8    PT Start Time  1003    PT Stop Time  1048    PT Time Calculation (min)  45 min    Activity Tolerance  Patient limited by pain    Behavior During Therapy  [facility] for tasks assessed/performed       Past Medical History:  Diagnosis Date  . Colitis   . Hypertension   . Kidney stone   . No pertinent past medical history     Past Surgical History:  Procedure Laterality Date  . CESAREAN SECTION    . TUBAL LIGATION      There were no vitals filed for this visit.  Subjective Assessment - 10/24/19 1018    Subjective  Pt reports overall she has been doing better with her L hip pain. Pt states she is using a combination of tylenol arthritis strength, stretching, and management of activity and positioning to manage pain. Pt rates her L hip pain as a 3/10.    Limitations  Sitting;Walking    Currently in Pain?  Yes    Pain Score  3     Pain Location  Hip    Pain Orientation  Left    Pain Descriptors / Indicators  Aching    Pain Type  Chronic pain    Pain Radiating Towards  NA    Aggravating Factors   Prolonged sitting, lying on L side, certain hip movements    Pain Relieving Factors  Pain meds and exercises    Effect of Pain on Daily Activities  Decrease tolerance to activities    Multiple Pain Sites  No                       [facility] Adult PT Treatment/Exercise - 10/25/19 0001      Knee/Hip Exercises: Stretches    Passive Hamstring Stretch  Left;10 seconds;Limitations;2 reps    Passive Hamstring Stretch Limitations  knee flex to SLR to decrease strain    Hip Flexor Stretch  Left;10 seconds;Limitations;2 reps    Hip Flexor Stretch Limitations  With seat of chair; 10reps    Piriformis Stretch  Left;20 seconds;2 reps    Other Knee/Hip Stretches  supine ER stretch 2x20 sec    Other Knee/Hip Stretches  supine KTC;2 x;20 sec; AROM 94d        Knee/Hip Exercises: Supine   Bridges Limitations  partial bridge with legs on reversed incline wedge attempted, but increased pt's pain    Other Supine Knee/Hip Exercises  gentle hip adduction isometric with ball 3 sec x 10 reps    Other Supine Knee/Hip Exercises  clamshell yellow band x 15 reps      Manual Therapy   Manual Therapy  Soft tissue mobilization    Joint Mobilization  Grade 3,4 L hip axial/lat distraction, 10 mins  PT Short Term Goals - 09/28/19 1324      PT SHORT TERM GOAL #1   Title  Pt will be ind in a HEP to address L hip ROM, function, and pain.    Baseline  Pt is Ind with her initial HEP to address L hip ROM, function, and pain    Status  Achieved        PT Long Term Goals - 09/28/19 1330      PT LONG TERM GOAL #1   Title  Pt will report a pain level range of 0-5/10 with daily acctivities.    Baseline  7-8/10    Status  On-going    Target Date  11/15/19      PT LONG TERM GOAL #2   Title  L hip AROM will increase to 90d for improved tolerance and quality of STS activities.    Baseline  L hip AROM = 94d with pain at end range    Target Date  11/15/19      PT LONG TERM GOAL #3   Title  5x STS test will improve to 30 sec or less to reflect improved function and pain of the L hip.    Baseline  5 STS 44.1 sec    Status  On-going    Target Date  11/15/19      PT LONG TERM GOAL #4   Title  Pt will voiced understanding of positional and activity factors which positively/negatively impact her L hip pain    Baseline   Pt's understanding of positions and activities which negatively increase her pain is much improved with pt able to recall positions and activities of concern.    Status  Partially Met    Target Date  11/15/19            Plan - 10/25/19 0755    Clinical Impression Statement  Pt is better managing her L hip pain as noted in subjective by various techniques, ie, meds, exercise, positional and activity management.    Personal Factors and Comorbidities  Comorbidity 1    Examination-Activity Limitations  Lift;Squat;Bend;Stairs;Dressing;Transfers;Stand;Locomotion Level;Bed Mobility;Sleep;Sit    Examination-Participation Restrictions  Cleaning;Laundry;Shop;Yard Work;Other    Stability/Clinical Decision Making  Stable/Uncomplicated    Clinical Decision Making  Low    Rehab Potential  Good    PT Frequency  2x / week    PT Duration  6 weeks    PT Treatment/Interventions  Electrical Stimulation;Iontophoresis 3m/ml Dexamethasone;Moist Heat;Gait training;Stair training;Therapeutic activities;Therapeutic exercise;Balance training;Patient/family education;Manual techniques;Traction;Passive range of motion;Dry needling;Energy conservation    PT Next Visit Plan  Initiate aerobic ex.       Patient will benefit from skilled therapeutic intervention in order to improve the following deficits and impairments:  Improper body mechanics, Pain, Decreased mobility, Decreased activity tolerance, Decreased range of motion, Decreased strength, Difficulty walking, Impaired flexibility  Visit Diagnosis: Pain in left hip  Muscle weakness (generalized)     Problem List Patient Active Problem List   Diagnosis Date Noted  . Pancreatitis, acute 08/18/2012  . [place] 08/18/[place]  . Renal insufficiency 08/18/2012  . Diarrhea 08/18/2012   AGar PontoMS, PT 10/25/19 8:13 AM  [facility]GSle[place] [address]Phone: [phone]  Fax:  [phone] Name: Christine RPinki RottmanMRN: [id number] of Birth: [birth year]

## 2019-10-26 ENCOUNTER — Ambulatory Visit: Payer: Medicaid Other

## 2019-10-26 ENCOUNTER — Other Ambulatory Visit: Payer: Self-pay

## 2019-10-26 DIAGNOSIS — M6281 Muscle weakness (generalized): Secondary | ICD-10-CM

## 2019-10-26 DIAGNOSIS — M25552 Pain in left hip: Secondary | ICD-10-CM | POA: Diagnosis not present

## 2019-10-26 NOTE — Therapy (Signed)
[place], [place], [address] Phone: [phone]   Fax:  [phone]  Physical Therapy Treatment  Patient Details  Name: Christine Rowe MRN: [id number] Date of Birth: [birth year] Referring Provider (PT): Dyann Ruddle, MD   Encounter Date: 10/26/2019  PT End of Session - 10/26/19 1432    Visit Number  7    Number of Visits  12    Date for PT Re-Evaluation  11/18/19    Authorization Type  Medicaid    Authorization - Visit Number  3    Authorization - Number of Visits  8    PT Start Time  1004    PT Stop Time  1054    PT Time Calculation (min)  50 min    Activity Tolerance  Patient tolerated treatment well    Behavior During Therapy  [facility] for tasks assessed/performed       Past Medical History:  Diagnosis Date  . Colitis   . Hypertension   . Kidney stone   . No pertinent past medical history     Past Surgical History:  Procedure Laterality Date  . CESAREAN SECTION    . TUBAL LIGATION      There were no vitals filed for this visit.  Subjective Assessment - 10/26/19 1015    Subjective  Pt reports she is doing well today. Rates L hip pain as a 5/10, but she states she has not completed any stretches or warmed up thhis AM.    Multiple Pain Sites  No                       [facility] Adult PT Treatment/Exercise - 10/26/19 0001      Knee/Hip Exercises: Stretches   Hip Flexor Stretch  Left;10 seconds;Limitations;3 reps      Knee/Hip Exercises: Aerobic   Stepper  5 min, level 1      Knee/Hip Exercises: Supine   Other Supine Knee/Hip Exercises  gentle hip adduction isometric with ball 3 sec x 10 reps    Other Supine Knee/Hip Exercises  clamshell yellow band x 15 reps      Modalities   Modalities  Cryotherapy      Moist Heat Therapy   Number Minutes Moist Heat  10 Minutes    Moist Heat Location  Hip   anterior     Manual Therapy   Manual Therapy  Soft tissue mobilization     Joint Mobilization  Grade 3,4 L hip axial/lat distraction, 10 mins    Soft tissue mobilization  XFM to and l hip flexor tendon for 5 mins             PT Education - 10/26/19 1427    Education Details  New HEP for hip flexor stretch; XFM to the hip flexor tendon near insertion 3x daily; use of cold pack 10-15 mins as 3-4x daily as needed.    Person(s) Educated  Patient    Methods  Explanation;Demonstration;Tactile cues;Verbal cues;Handout    Comprehension  Need further instruction;Tactile cues required;Verbal cues required;Returned demonstration;Verbalized understanding       PT Short Term Goals - 09/28/19 1324      PT SHORT TERM GOAL #1   Title  Pt will be ind in a HEP to address L hip ROM, function, and pain.    Baseline  Pt is Ind with her initial HEP to address L hip ROM, function, and pain    Status  Achieved        PT Long Term Goals - 09/28/19 1330      PT LONG TERM GOAL #1   Title  Pt will report a pain level range of 0-5/10 with daily acctivities.    Baseline  7-8/10    Status  On-going    Target Date  11/15/19      PT LONG TERM GOAL #2   Title  L hip AROM will increase to 90d for improved tolerance and quality of STS activities.    Baseline  L hip AROM = 94d with pain at end range    Target Date  11/15/19      PT LONG TERM GOAL #3   Title  5x STS test will improve to 30 sec or less to reflect improved function and pain of the L hip.    Baseline  5 STS 44.1 sec    Status  On-going    Target Date  11/15/19      PT LONG TERM GOAL #4   Title  Pt will voiced understanding of positional and activity factors which positively/negatively impact her L hip pain    Baseline  Pt's understanding of positions and activities which negatively increase her pain is much improved with pt able to recall positions and activities of concern.    Status  Partially Met    Target Date  11/15/19            Plan - 10/26/19 1435    Clinical Impression Statement  Pt  tolerated the initiation of stepper for 5 mins. Limited by endurance. ROM assess of L hip found flexion ROM limited to 90d by pain and IR to neutral by tightness and pain. Pt ws most significantly painful c palpation to the L hip flexor/ASIS. Applied and instructed pt in XFM, hip flexor stretch, and the use of a cold pack with pt demonstrating possible imflamation/tendonitis of this area. Pt is to hold on other exs for a few days to assess response. Pt. will add them in as tolerated. Pt reports liking the hip strengthening exs and tolerating them well.    Examination-Activity Limitations  Lift;Squat;Bend;Stairs;Dressing;Stand;Bed Mobility;Sleep;Sit;Locomotion Level;Transfers    PT Treatment/Interventions  Electrical Stimulation;Iontophoresis 4m/ml Dexamethasone;Moist Heat;Gait training;Stair training;Therapeutic activities;Therapeutic exercise;Balance training;Patient/family education;Manual techniques;Traction;Passive range of motion;Dry needling;Energy conservation    PT Next Visit Plan  Assess response to XFM, hip flexor stretch and cold pack    PT Home Exercise Plan  [id number]. New HEP for hip flexor stretch; XFM to the hip flexor tendon near insertion 3x daily; use of cold pack 10-15 mins as 3-4x daily as needed.       Patient will benefit from skilled therapeutic intervention in order to improve the following deficits and impairments:  Improper body mechanics, Pain, Decreased mobility, Decreased activity tolerance, Decreased range of motion, Decreased strength, Difficulty walking, Impaired flexibility  Visit Diagnosis: Pain in left hip  Muscle weakness (generalized)     Problem List Patient Active Problem List   Diagnosis Date Noted  . Pancreatitis, acute 08/18/2012  . S[place] ([place]ic [place] response syndrome) [place] 08/18/2012  . Renal insufficiency 08/18/2012  . Diarrhea 08/18/2012    AGar PontoMS, PT 10/26/19 2:48 PM  [facility] [address]Phone: [phone]  Fax:  [phone] Name: Christine RShyan ScalisiMRN: [id number] of Birth: [birth year]

## 2019-10-31 ENCOUNTER — Encounter: Payer: Self-pay | Admitting: Physical Therapy

## 2019-10-31 ENCOUNTER — Other Ambulatory Visit: Payer: Self-pay

## 2019-10-31 ENCOUNTER — Ambulatory Visit: Payer: Medicaid Other | Admitting: Physical Therapy

## 2019-10-31 DIAGNOSIS — M6281 Muscle weakness (generalized): Secondary | ICD-10-CM

## 2019-10-31 DIAGNOSIS — M25552 Pain in left hip: Secondary | ICD-10-CM

## 2019-10-31 NOTE — Therapy (Signed)
[place], [place], [address] Phone: [phone]   Fax:  [phone]  Physical Therapy Treatment  Patient Details  Name: Christine Rowe MRN: [id number] Date of Birth: 1976/07/01 Referring Provider (PT): Dyann Ruddle, MD   Encounter Date: 10/31/2019  PT End of Session - 10/31/19 0902    Visit Number  8    Number of Visits  12    Date for PT Re-Evaluation  11/18/19    Authorization Type  Medicaid    Authorization Time Period  3/24/-4/20    Authorization - Visit Number  4    Authorization - Number of Visits  8    PT Start Time  [phone]    Activity Tolerance  Patient limited by pain    Behavior During Therapy  [facility] for tasks assessed/performed       Past Medical History:  Diagnosis Date  . Colitis   . Hypertension   . Kidney stone   . No pertinent past medical history     Past Surgical History:  Procedure Laterality Date  . CESAREAN SECTION    . TUBAL LIGATION      There were no vitals filed for this visit.  Subjective Assessment - 10/31/19 0847    Subjective  Hip did OK over the weekend but had some increased pain last night turning to left side in bed. Pain 6/10 this AM.    Currently in Pain?  Yes    Pain Score  6     Pain Location  Hip    Pain Orientation  Left    Pain Descriptors / Indicators  Aching    Pain Type  Chronic pain    Pain Onset  More than a month ago    Pain Frequency  Intermittent    Aggravating Factors   prolonged sitting, lying on left side, certain hip movements    Pain Relieving Factors  medication and exercise    Effect of Pain on Daily Activities  decreases activity tolerance                       [facility] Adult PT Treatment/Exercise - 10/31/19 0001      Knee/Hip Exercises: Stretches   Hip Flexor Stretch  Left;3 reps;30 seconds    Hip Flexor Stretch Limitations  gentle supine manual left quad/hip flexor stretch with left LE off edge of mat      Knee/Hip Exercises: Aerobic   Stepper  5 min, level 1      Knee/Hip Exercises: Standing   Hip Abduction  AROM;Stengthening;Left;2 sets;10 reps    Forward Step Up  Left;2 sets;10 reps;Hand Hold: 2;Step Height: 4"      Knee/Hip Exercises: Supine   Bridges  AROM;Strengthening;Both;10 reps    Bridges Limitations  partial bridge    Other Supine Knee/Hip Exercises  hip adduction isometric with ball squeeze 3 sec x 15 reps    Other Supine Knee/Hip Exercises  clamshell yellow band x 15 reps      Manual Therapy   Joint Mobilization  left hip LAD grade I-III oscillations    Soft tissue mobilization  cross friction STM left hip flexor tendon region       Trigger Point Dry Needling - 10/31/19 0001    Consent Given?  Yes    Education Handout Provided  Yes    Muscles Treated Back/Hip  Iliacus;Iliopsoas    Dry Needling Comments  needling from sidelying with 30 gauge 50  mm needle for iliacus and .40 x 100 mm needle for psoas    Electrical Stimulation Performed with Dry Needling  Yes    E-stim with Dry Needling Details  TENS 2 pps x 10 minutes             PT Short Term Goals - 09/28/19 1324      PT SHORT TERM GOAL #1   Title  Pt will be ind in a HEP to address L hip ROM, function, and pain.    Baseline  Pt is Ind with her initial HEP to address L hip ROM, function, and pain    Status  Achieved        PT Long Term Goals - 09/28/19 1330      PT LONG TERM GOAL #1   Title  Pt will report a pain level range of 0-5/10 with daily acctivities.    Baseline  7-8/10    Status  On-going    Target Date  11/15/19      PT LONG TERM GOAL #2   Title  L hip AROM will increase to 90d for improved tolerance and quality of STS activities.    Baseline  L hip AROM = 94d with pain at end range    Target Date  11/15/19      PT LONG TERM GOAL #3   Title  5x STS test will improve to 30 sec or less to reflect improved function and pain of the L hip.    Baseline  5 STS 44.1 sec    Status  On-going     Target Date  11/15/19      PT LONG TERM GOAL #4   Title  Pt will voiced understanding of positional and activity factors which positively/negatively impact her L hip pain    Baseline  Pt's understanding of positions and activities which negatively increase her pain is much improved with pt able to recall positions and activities of concern.    Status  Partially Met    Target Date  11/15/19            Plan - 10/31/19 0903    Clinical Impression Statement  Fair progress with improvement after last visit but some setback with symptoms exacerbation over the weekend with continued anterior hip and groin pain issues with differential diagnosis including muscular etiology for hip flexor tendons vs. potential intrinsic joint issue. Trial dry needling today to hip flexors with good tolerance-will await further response by next session. ERO next visit-if pain symptoms persist recommend MD follow up for further assessment.    Personal Factors and Comorbidities  Comorbidity 1    Comorbidities  obesity    Examination-Activity Limitations  Lift;Squat;Bend;Stairs;Dressing;Stand;Bed Mobility;Sleep;Sit;Locomotion Level;Transfers    Examination-Participation Restrictions  Cleaning;Laundry;Shop;Yard Work;Other    Stability/Clinical Decision Making  Stable/Uncomplicated    Clinical Decision Making  Low    Rehab Potential  Good    PT Frequency  2x / week    PT Duration  6 weeks    PT Treatment/Interventions  Electrical Stimulation;Iontophoresis 34m/ml Dexamethasone;Moist Heat;Gait training;Stair training;Therapeutic activities;Therapeutic exercise;Balance training;Patient/family education;Manual techniques;Traction;Passive range of motion;Dry needling;Energy conservation    PT Next Visit Plan  Re-eval/ERO next visit, check response dry needling, continue cross friction STM, LAD, exercise progression as tolerated    PT Home Exercise Plan  [id number]. New HEP for hip flexor stretch; XFM to the hip flexor tendon  near insertion 3x daily; use of cold pack 10-15 mins as 3-4x daily as needed.  Consulted and Agree with Plan of Care  Patient       Patient will benefit from skilled therapeutic intervention in order to improve the following deficits and impairments:  Improper body mechanics, Pain, Decreased mobility, Decreased activity tolerance, Decreased range of motion, Decreased strength, Difficulty walking, Impaired flexibility  Visit Diagnosis: Pain in left hip  Muscle weakness (generalized)     Problem List Patient Active Problem List   Diagnosis Date Noted  . Pancreatitis, acute 08/18/2012  . SIRS (systemic inflammatory response syndrome) ([place]) 08/18/2012  . Renal insufficiency 08/18/2012  . Diarrhea 08/18/2012    Beaulah Dinning, PT, DPT 10/31/19 9:22 AM  [facility] [address] [place], [place], [address] Phone: [phone]   Fax:  [phone]  Name: Christine Rowe MRN: [id number] Date of Birth: 1976-02-05

## 2019-10-31 NOTE — Therapy (Signed)
[place], [place], [address] Phone: [phone]   Fax:  [phone]  Physical Therapy Treatment  Patient Details  Name: Christine Rowe MRN: [id number] Date of Birth: 1975-09-06 Referring Provider (PT): Dyann Ruddle, MD   Encounter Date: 10/31/2019  PT End of Session - 10/31/19 0902    Visit Number  8    Number of Visits  12    Date for PT Re-Evaluation  11/18/19    Authorization Type  Medicaid    Authorization Time Period  3/24/-4/20    Authorization - Visit Number  4    Authorization - Number of Visits  8    PT Start Time  0846    PT Stop Time  0928    PT Time Calculation (min)  42 min    Activity Tolerance  Patient limited by pain    Behavior During Therapy  [facility] for tasks assessed/performed       Past Medical History:  Diagnosis Date  . Colitis   . Hypertension   . Kidney stone   . No pertinent past medical history     Past Surgical History:  Procedure Laterality Date  . CESAREAN SECTION    . TUBAL LIGATION      There were no vitals filed for this visit.  Subjective Assessment - 10/31/19 0847    Subjective  Hip did OK over the weekend but had some increased pain last night turning to left side in bed. Pain 6/10 this AM.    Currently in Pain?  Yes    Pain Score  6     Pain Location  Hip    Pain Orientation  Left    Pain Descriptors / Indicators  Aching    Pain Type  Chronic pain    Pain Onset  More than a month ago    Pain Frequency  Intermittent    Aggravating Factors   prolonged sitting, lying on left side, certain hip movements    Pain Relieving Factors  medication and exercise    Effect of Pain on Daily Activities  decreases activity tolerance                       [facility] Adult PT Treatment/Exercise - 10/31/19 0001      Knee/Hip Exercises: Stretches   Hip Flexor Stretch  Left;3 reps;30 seconds    Hip Flexor Stretch Limitations  gentle supine manual left  quad/hip flexor stretch with left LE off edge of mat      Knee/Hip Exercises: Aerobic   Stepper  5 min, level 1      Knee/Hip Exercises: Standing   Hip Abduction  AROM;Stengthening;Left;2 sets;10 reps    Forward Step Up  Left;2 sets;10 reps;Hand Hold: 2;Step Height: 4"      Knee/Hip Exercises: Supine   Bridges  AROM;Strengthening;Both;10 reps    Bridges Limitations  partial bridge    Other Supine Knee/Hip Exercises  hip adduction isometric with ball squeeze 3 sec x 15 reps    Other Supine Knee/Hip Exercises  clamshell yellow band x 15 reps      Manual Therapy   Joint Mobilization  left hip LAD grade I-III oscillations    Soft tissue mobilization  cross friction STM left hip flexor tendon region       Trigger Point Dry Needling - 10/31/19 0001    Consent Given?  Yes    Education Handout Provided  Yes  Muscles Treated Back/Hip  Iliacus;Iliopsoas    Dry Needling Comments  needling from sidelying with 30 gauge 50 mm needle for iliacus and .40 x 100 mm needle for psoas    Electrical Stimulation Performed with Dry Needling  Yes    E-stim with Dry Needling Details  TENS 2 pps x 10 minutes             PT Short Term Goals - 09/28/19 1324      PT SHORT TERM GOAL #1   Title  Pt will be ind in a HEP to address L hip ROM, function, and pain.    Baseline  Pt is Ind with her initial HEP to address L hip ROM, function, and pain    Status  Achieved        PT Long Term Goals - 09/28/19 1330      PT LONG TERM GOAL #1   Title  Pt will report a pain level range of 0-5/10 with daily acctivities.    Baseline  7-8/10    Status  On-going    Target Date  11/15/19      PT LONG TERM GOAL #2   Title  L hip AROM will increase to 90d for improved tolerance and quality of STS activities.    Baseline  L hip AROM = 94d with pain at end range    Target Date  11/15/19      PT LONG TERM GOAL #3   Title  5x STS test will improve to 30 sec or less to reflect improved function and pain of the  L hip.    Baseline  5 STS 44.1 sec    Status  On-going    Target Date  11/15/19      PT LONG TERM GOAL #4   Title  Pt will voiced understanding of positional and activity factors which positively/negatively impact her L hip pain    Baseline  Pt's understanding of positions and activities which negatively increase her pain is much improved with pt able to recall positions and activities of concern.    Status  Partially Met    Target Date  11/15/19            Plan - 10/31/19 0903    Clinical Impression Statement  Fair progress with improvement after last visit but some setback with symptoms exacerbation over the weekend with continued anterior hip and groin pain issues with differential diagnosis including muscular etiology for hip flexor tendons vs. potential intrinsic joint issue. Trial dry needling today to hip flexors with good tolerance-will await further response by next session. ERO next visit-if pain symptoms persist recommend MD follow up for further assessment.    Personal Factors and Comorbidities  Comorbidity 1    Comorbidities  obesity    Examination-Activity Limitations  Lift;Squat;Bend;Stairs;Dressing;Stand;Bed Mobility;Sleep;Sit;Locomotion Level;Transfers    Examination-Participation Restrictions  Cleaning;Laundry;Shop;Yard Work;Other    Stability/Clinical Decision Making  Stable/Uncomplicated    Clinical Decision Making  Low    Rehab Potential  Good    PT Frequency  2x / week    PT Duration  6 weeks    PT Treatment/Interventions  Electrical Stimulation;Iontophoresis 81m/ml Dexamethasone;Moist Heat;Gait training;Stair training;Therapeutic activities;Therapeutic exercise;Balance training;Patient/family education;Manual techniques;Traction;Passive range of motion;Dry needling;Energy conservation    PT Next Visit Plan  Re-eval/ERO next visit, check response dry needling, continue cross friction STM, LAD, exercise progression as tolerated    PT Home Exercise Plan  [id number].  New HEP for hip flexor stretch; XFM to the  hip flexor tendon near insertion 3x daily; use of cold pack 10-15 mins as 3-4x daily as needed.    Consulted and Agree with Plan of Care  Patient       Patient will benefit from skilled therapeutic intervention in order to improve the following deficits and impairments:  Improper body mechanics, Pain, Decreased mobility, Decreased activity tolerance, Decreased range of motion, Decreased strength, Difficulty walking, Impaired flexibility  Visit Diagnosis: Pain in left hip  Muscle weakness (generalized)     Problem List Patient Active Problem List   Diagnosis Date Noted  . Pancreatitis, acute 08/18/2012  . SIRS (systemic inflammatory response syndrome) ([place]) 08/18/2012  . Renal insufficiency 08/18/2012  . Diarrhea 08/18/2012    Beaulah Dinning, PT, DPT 10/31/19 9:25 AM  [facility] [address] [place], [place], [address] Phone: [phone]   Fax:  [phone]  Name: Christine Rowe MRN: [id number] Date of Birth: 09/19/1975

## 2019-11-02 ENCOUNTER — Ambulatory Visit: Payer: Medicaid Other | Admitting: Physical Therapy

## 2019-11-02 ENCOUNTER — Other Ambulatory Visit: Payer: Self-pay

## 2019-11-02 ENCOUNTER — Encounter: Payer: Self-pay | Admitting: Physical Therapy

## 2019-11-02 DIAGNOSIS — M6281 Muscle weakness (generalized): Secondary | ICD-10-CM

## 2019-11-02 DIAGNOSIS — M25552 Pain in left hip: Secondary | ICD-10-CM

## 2019-11-02 NOTE — Therapy (Signed)
[place], [place], [address] Phone: [phone]   Fax:  [phone]  Physical Therapy Treatment/Re-evaluation/Discharge  Patient Details  Name: Christine Rowe MRN: [id number] Date of Birth: 03-26-1976 Referring Provider (PT): Karlton Lemon, MD   Encounter Date: 11/02/2019  PT End of Session - 11/02/19 0924    Visit Number  9    Number of Visits  12    Date for PT Re-Evaluation  11/18/19    Authorization Type  Medicaid-previous authorization expired    PT Start Time  0846    PT Stop Time  0920    PT Time Calculation (min)  34 min    Activity Tolerance  Patient tolerated treatment well    Behavior During Therapy  [facility] for tasks assessed/performed       Past Medical History:  Diagnosis Date  . Colitis   . Hypertension   . Kidney stone   . No pertinent past medical history     Past Surgical History:  Procedure Laterality Date  . CESAREAN SECTION    . TUBAL LIGATION      There were no vitals filed for this visit.  Subjective Assessment - 11/02/19 0847    Subjective  Pt. reports noted mild improvement the day of last session after addition dry needling but then relief more pronounced yesterday. Left hip pain down to 2/10 this morning (6/10 last session). Pt. rates improvement from baseline since starting therapy at around 75-80%. She reports some improvements with positional tolerance for sitting and standing as well as improving walking tolerance and greater ease with bed mobility. Continued limitations include difficulty with initial sit>stand after sitting or lying in bed as well as difficulty tolerating car travel and ADLs involving hip and trunk flexion such as donning shoes. Discussed status and pt. reports feels ready to d/c therapy for now and continue with HEP-will follow up with MD with any peristent symptoms for further assessment.    Limitations  Sitting;Walking    How long can you sit  comfortably?  no limitations    How long can you stand comfortably?  5-6 hours    How long can you walk comfortably?  no limitations    Patient Stated Goals  For L hip pain to improve and return to being more active and exercising.    Currently in Pain?  Yes    Pain Score  2     Pain Location  Hip    Pain Orientation  Left    Pain Descriptors / Indicators  Aching    Pain Type  Chronic pain    Pain Onset  More than a month ago    Pain Frequency  Intermittent    Aggravating Factors   hip flexion AROM, lying on left site, sit>stand    Pain Relieving Factors  medication and exercise         [facility] PT Assessment - 11/02/19 0001      Assessment   Medical Diagnosis  L hip pain    Referring Provider (PT)  Karlton Lemon, MD      AROM   Left Hip Flexion  70    Left Hip External Rotation   60    Left Hip Internal Rotation   10    Left Hip ABduction  40    Left Hip ADduction  --   [facility]     Strength   Strength Assessment Site  Hip    Right/Left Hip  Left  Left Hip Flexion  3+/5    Left Hip Extension  4-/5    Left Hip External Rotation  4/5    Left Hip Internal Rotation  4/5    Left Hip ABduction  4/5    Left Hip ADduction  4/5      Transfers   Five time sit to stand comments   --   18.7 sec     Ambulation/Gait   Ambulation Distance (Feet)  322 Feet   2 min walk                  [facility] Adult PT Treatment/Exercise - 11/02/19 0001      Knee/Hip Exercises: Stretches   Hip Flexor Stretch  Left;3 reps;30 seconds    Hip Flexor Stretch Limitations  gentle supine manual left quad/hip flexor stretch with left LE off edge of mat      Manual Therapy   Joint Mobilization  left hip LAD grade I-III oscillations    Soft tissue mobilization  cross friction STM left hip flexor tendon region             PT Education - 11/02/19 0903    Education Details  HEP, POC    Person(s) Educated  Patient    Methods  Explanation;Verbal cues    Comprehension  Verbalized  understanding;Returned demonstration       PT Short Term Goals - 11/02/19 0922      PT SHORT TERM GOAL #1   Title  Pt will be ind in a HEP to address L hip ROM, function, and pain.    Baseline  met    Time  3    Period  Weeks    Status  Achieved        PT Long Term Goals - 11/02/19 5701      PT LONG TERM GOAL #1   Title  Pt will report a pain level range of 0-5/10 with daily acctivities.    Baseline  2/10 today, recently/intermittently higher but pain level improving    Time  9    Period  Weeks    Status  Partially Met      PT LONG TERM GOAL #2   Title  L hip AROM will increase to 90d for improved tolerance and quality of STS activities.    Baseline  70 deg today    Time  9    Period  Weeks    Status  Not Met      PT LONG TERM GOAL #3   Title  5x STS test will improve to 30 sec or less to reflect improved function and pain of the L hip.    Baseline  met-see objective    Time  9    Period  Weeks    Status  Achieved      PT LONG TERM GOAL #4   Title  Pt will voiced understanding of positional and activity factors which positively/negatively impact her L hip pain    Baseline  met    Time  9    Period  Weeks    Status  Achieved            Plan - 11/02/19 0904    Clinical Impression Statement  Pt. has attended 9 therapy visits with improvement as noted in subjective. Still with left hip weakness and limited AROM in hip flexion and IR-hopeful at this point that pt. can continue progress independently with HEP. If symptoms persist or worsen  recommend pt. to follow up with MD for further assessment.    Personal Factors and Comorbidities  Comorbidity 1    Comorbidities  obesity    Examination-Activity Limitations  Lift;Squat;Bend;Stairs;Dressing;Stand;Bed Mobility;Sleep;Sit;Locomotion Level;Transfers    Examination-Participation Restrictions  Cleaning;Laundry;Shop;Yard Work;Other    Stability/Clinical Decision Making  Stable/Uncomplicated    Clinical Decision Making   Low    Rehab Potential  Good    PT Frequency  2x / week    PT Duration  6 weeks    PT Treatment/Interventions  Electrical Stimulation;Iontophoresis 40m/ml Dexamethasone;Moist Heat;Gait training;Stair training;Therapeutic activities;Therapeutic exercise;Balance training;Patient/family education;Manual techniques;Traction;Passive range of motion;Dry needling;Energy conservation    PT Next Visit Plan  NA    PT Home Exercise Plan  [id number]. New HEP for hip flexor stretch; XFM to the hip flexor tendon near insertion 3x daily; use of cold pack 10-15 mins as 3-4x daily as needed.    Consulted and Agree with Plan of Care  Patient       Patient will benefit from skilled therapeutic intervention in order to improve the following deficits and impairments:  Improper body mechanics, Pain, Decreased mobility, Decreased activity tolerance, Decreased range of motion, Decreased strength, Difficulty walking, Impaired flexibility  Visit Diagnosis: Pain in left hip  Muscle weakness (generalized)     Problem List Patient Active Problem List   Diagnosis Date Noted  . Pancreatitis, acute 08/18/2012  . SIRS (systemic inflammatory response syndrome) ([place] 08/18/2012  . Renal insufficiency 08/18/2012  . Diarrhea 08/18/2012        PHYSICAL THERAPY DISCHARGE SUMMARY  Visits from Start of Care: 9  Current functional level related to goals / functional outcomes: See above-goals partially met   Remaining deficits: Left hip pain and weakness   Education / Equipment: HEP, POC, Theraband and tennis ball issued for self STM, Biofreeze samples given  Plan: Patient agrees to discharge.  Patient goals were partially met. Patient is being discharged due to meeting the stated rehab goals.  ?????          CBeaulah Dinning PT, DPT 11/02/19 9:27 AM    [facility] [address]Phone: [phone]  Fax:   [phone] Name: Christine RRanay KetterMRN: [id number] of Birth: [birth year]

## 2022-10-14 ENCOUNTER — Other Ambulatory Visit: Payer: Self-pay

## 2022-10-14 ENCOUNTER — Ambulatory Visit
Admission: RE | Admit: 2022-10-14 | Discharge: 2022-10-14 | Disposition: A | Discharge status: Home / Self Care | Payer: Medicaid Other | Source: Ambulatory Visit | Attending: Family Medicine | Admitting: Family Medicine

## 2022-10-14 DIAGNOSIS — M542 Cervicalgia: Secondary | ICD-10-CM
